# Patient Record
Sex: Female | Born: 1935 | Race: White | Hispanic: No | Smoking: Former smoker
Health system: Southern US, Community
[De-identification: ages and names within clinical notes are randomized; demographics above are authoritative.]

## PROBLEM LIST (undated history)

## (undated) DIAGNOSIS — F0390 Unspecified dementia without behavioral disturbance: Secondary | ICD-10-CM

## (undated) DIAGNOSIS — E119 Type 2 diabetes mellitus without complications: Secondary | ICD-10-CM

## (undated) DIAGNOSIS — M810 Age-related osteoporosis without current pathological fracture: Secondary | ICD-10-CM

## (undated) DIAGNOSIS — I10 Essential (primary) hypertension: Secondary | ICD-10-CM

## (undated) DIAGNOSIS — J449 Chronic obstructive pulmonary disease, unspecified: Secondary | ICD-10-CM

## (undated) HISTORY — DX: Type 2 diabetes mellitus without complications: E11.9

## (undated) HISTORY — DX: Chronic obstructive pulmonary disease, unspecified: J44.9

## (undated) HISTORY — DX: Age-related osteoporosis without current pathological fracture: M81.0

## (undated) HISTORY — PX: TOTAL HIP ARTHROPLASTY: SHX124

## (undated) HISTORY — PX: OTHER SURGICAL HISTORY: SHX169

## (undated) HISTORY — DX: Essential (primary) hypertension: I10

---

## 2012-01-21 DIAGNOSIS — I4891 Unspecified atrial fibrillation: Secondary | ICD-10-CM

## 2012-01-22 DIAGNOSIS — I319 Disease of pericardium, unspecified: Secondary | ICD-10-CM

## 2012-01-23 DIAGNOSIS — I4891 Unspecified atrial fibrillation: Secondary | ICD-10-CM

## 2012-01-24 DIAGNOSIS — I4891 Unspecified atrial fibrillation: Secondary | ICD-10-CM

## 2012-02-07 ENCOUNTER — Encounter: Payer: Self-pay | Admitting: Cardiology

## 2012-02-08 ENCOUNTER — Encounter: Payer: Medicare Other | Admitting: Physician Assistant

## 2019-05-05 ENCOUNTER — Encounter (HOSPITAL_COMMUNITY): Payer: Self-pay | Admitting: Internal Medicine

## 2019-05-05 ENCOUNTER — Inpatient Hospital Stay (HOSPITAL_COMMUNITY)
Admission: AD | Admit: 2019-05-05 | Discharge: 2019-06-09 | DRG: 480 | Disposition: E | Payer: Medicare Other | Source: Other Acute Inpatient Hospital | Attending: Internal Medicine | Admitting: Internal Medicine

## 2019-05-05 DIAGNOSIS — E1122 Type 2 diabetes mellitus with diabetic chronic kidney disease: Secondary | ICD-10-CM | POA: Diagnosis present

## 2019-05-05 DIAGNOSIS — I10 Essential (primary) hypertension: Secondary | ICD-10-CM | POA: Diagnosis present

## 2019-05-05 DIAGNOSIS — K5641 Fecal impaction: Secondary | ICD-10-CM | POA: Diagnosis not present

## 2019-05-05 DIAGNOSIS — M810 Age-related osteoporosis without current pathological fracture: Secondary | ICD-10-CM | POA: Diagnosis present

## 2019-05-05 DIAGNOSIS — J9601 Acute respiratory failure with hypoxia: Secondary | ICD-10-CM | POA: Diagnosis not present

## 2019-05-05 DIAGNOSIS — I129 Hypertensive chronic kidney disease with stage 1 through stage 4 chronic kidney disease, or unspecified chronic kidney disease: Secondary | ICD-10-CM | POA: Diagnosis present

## 2019-05-05 DIAGNOSIS — N131 Hydronephrosis with ureteral stricture, not elsewhere classified: Secondary | ICD-10-CM | POA: Diagnosis present

## 2019-05-05 DIAGNOSIS — Z87891 Personal history of nicotine dependence: Secondary | ICD-10-CM

## 2019-05-05 DIAGNOSIS — Z794 Long term (current) use of insulin: Secondary | ICD-10-CM

## 2019-05-05 DIAGNOSIS — Z79899 Other long term (current) drug therapy: Secondary | ICD-10-CM

## 2019-05-05 DIAGNOSIS — S72009A Fracture of unspecified part of neck of unspecified femur, initial encounter for closed fracture: Secondary | ICD-10-CM | POA: Diagnosis present

## 2019-05-05 DIAGNOSIS — Z09 Encounter for follow-up examination after completed treatment for conditions other than malignant neoplasm: Secondary | ICD-10-CM

## 2019-05-05 DIAGNOSIS — Y92129 Unspecified place in nursing home as the place of occurrence of the external cause: Secondary | ICD-10-CM | POA: Diagnosis not present

## 2019-05-05 DIAGNOSIS — J90 Pleural effusion, not elsewhere classified: Secondary | ICD-10-CM | POA: Diagnosis present

## 2019-05-05 DIAGNOSIS — Z66 Do not resuscitate: Secondary | ICD-10-CM | POA: Diagnosis present

## 2019-05-05 DIAGNOSIS — N179 Acute kidney failure, unspecified: Secondary | ICD-10-CM | POA: Diagnosis not present

## 2019-05-05 DIAGNOSIS — I482 Chronic atrial fibrillation, unspecified: Secondary | ICD-10-CM | POA: Diagnosis present

## 2019-05-05 DIAGNOSIS — S52612A Displaced fracture of left ulna styloid process, initial encounter for closed fracture: Secondary | ICD-10-CM | POA: Diagnosis present

## 2019-05-05 DIAGNOSIS — R5383 Other fatigue: Secondary | ICD-10-CM

## 2019-05-05 DIAGNOSIS — E119 Type 2 diabetes mellitus without complications: Secondary | ICD-10-CM

## 2019-05-05 DIAGNOSIS — F0390 Unspecified dementia without behavioral disturbance: Secondary | ICD-10-CM | POA: Diagnosis present

## 2019-05-05 DIAGNOSIS — Z8249 Family history of ischemic heart disease and other diseases of the circulatory system: Secondary | ICD-10-CM | POA: Diagnosis not present

## 2019-05-05 DIAGNOSIS — J449 Chronic obstructive pulmonary disease, unspecified: Secondary | ICD-10-CM | POA: Diagnosis present

## 2019-05-05 DIAGNOSIS — G9341 Metabolic encephalopathy: Secondary | ICD-10-CM | POA: Diagnosis not present

## 2019-05-05 DIAGNOSIS — R0902 Hypoxemia: Secondary | ICD-10-CM

## 2019-05-05 DIAGNOSIS — W1830XA Fall on same level, unspecified, initial encounter: Secondary | ICD-10-CM | POA: Diagnosis present

## 2019-05-05 DIAGNOSIS — Z20822 Contact with and (suspected) exposure to covid-19: Secondary | ICD-10-CM | POA: Diagnosis present

## 2019-05-05 DIAGNOSIS — E1165 Type 2 diabetes mellitus with hyperglycemia: Secondary | ICD-10-CM | POA: Diagnosis present

## 2019-05-05 DIAGNOSIS — Z833 Family history of diabetes mellitus: Secondary | ICD-10-CM

## 2019-05-05 DIAGNOSIS — J9811 Atelectasis: Secondary | ICD-10-CM | POA: Diagnosis not present

## 2019-05-05 DIAGNOSIS — R109 Unspecified abdominal pain: Secondary | ICD-10-CM

## 2019-05-05 DIAGNOSIS — N183 Chronic kidney disease, stage 3 unspecified: Secondary | ICD-10-CM | POA: Diagnosis present

## 2019-05-05 DIAGNOSIS — Z419 Encounter for procedure for purposes other than remedying health state, unspecified: Secondary | ICD-10-CM

## 2019-05-05 DIAGNOSIS — R0989 Other specified symptoms and signs involving the circulatory and respiratory systems: Secondary | ICD-10-CM

## 2019-05-05 DIAGNOSIS — S72142A Displaced intertrochanteric fracture of left femur, initial encounter for closed fracture: Principal | ICD-10-CM | POA: Diagnosis present

## 2019-05-05 DIAGNOSIS — D72829 Elevated white blood cell count, unspecified: Secondary | ICD-10-CM | POA: Diagnosis not present

## 2019-05-05 DIAGNOSIS — K219 Gastro-esophageal reflux disease without esophagitis: Secondary | ICD-10-CM | POA: Diagnosis present

## 2019-05-05 HISTORY — DX: Unspecified dementia, unspecified severity, without behavioral disturbance, psychotic disturbance, mood disturbance, and anxiety: F03.90

## 2019-05-05 HISTORY — DX: Chronic atrial fibrillation, unspecified: I48.20

## 2019-05-05 LAB — SARS CORONAVIRUS 2 (TAT 6-24 HRS): SARS Coronavirus 2: NEGATIVE

## 2019-05-05 LAB — HEMOGLOBIN A1C
Hgb A1c MFr Bld: 9.1 % — ABNORMAL HIGH (ref 4.8–5.6)
Mean Plasma Glucose: 214.47 mg/dL

## 2019-05-05 LAB — MRSA PCR SCREENING: MRSA by PCR: NEGATIVE

## 2019-05-05 LAB — GLUCOSE, CAPILLARY
Glucose-Capillary: 162 mg/dL — ABNORMAL HIGH (ref 70–99)
Glucose-Capillary: 145 mg/dL — ABNORMAL HIGH (ref 70–99)

## 2019-05-05 MED ORDER — HYDROCODONE-ACETAMINOPHEN 5-325 MG PO TABS
1.0000 | ORAL_TABLET | Freq: Four times a day (QID) | ORAL | Status: DC | PRN
  Administered 2019-05-05: 1 via ORAL
  Filled 2019-05-05: qty 1

## 2019-05-05 MED ORDER — INSULIN ASPART 100 UNIT/ML ~~LOC~~ SOLN
0.0000 [IU] | Freq: Three times a day (TID) | SUBCUTANEOUS | Status: DC
  Administered 2019-05-05 – 2019-05-08 (×2): 2 [IU] via SUBCUTANEOUS
  Administered 2019-05-08: 1 [IU] via SUBCUTANEOUS
  Administered 2019-05-08 – 2019-05-09 (×2): 2 [IU] via SUBCUTANEOUS
  Administered 2019-05-11: 1 [IU] via SUBCUTANEOUS
  Administered 2019-05-11 – 2019-05-12 (×2): 2 [IU] via SUBCUTANEOUS
  Administered 2019-05-12 – 2019-05-13 (×2): 1 [IU] via SUBCUTANEOUS

## 2019-05-05 MED ORDER — POLYETHYLENE GLYCOL 3350 17 G PO PACK
17.0000 g | PACK | Freq: Every day | ORAL | Status: DC | PRN
  Administered 2019-05-08: 17 g via ORAL
  Filled 2019-05-05 (×3): qty 1

## 2019-05-05 MED ORDER — METHOCARBAMOL 500 MG PO TABS
500.0000 mg | ORAL_TABLET | Freq: Four times a day (QID) | ORAL | Status: DC | PRN
  Administered 2019-05-05: 500 mg via ORAL
  Filled 2019-05-05: qty 1

## 2019-05-05 MED ORDER — BISACODYL 5 MG PO TBEC
5.0000 mg | DELAYED_RELEASE_TABLET | Freq: Every day | ORAL | Status: DC | PRN
  Administered 2019-05-08 – 2019-05-09 (×2): 5 mg via ORAL
  Filled 2019-05-05 (×3): qty 1

## 2019-05-05 MED ORDER — PANTOPRAZOLE SODIUM 40 MG PO TBEC
40.0000 mg | DELAYED_RELEASE_TABLET | Freq: Every day | ORAL | Status: DC
  Administered 2019-05-06 – 2019-05-13 (×5): 40 mg via ORAL
  Filled 2019-05-05 (×7): qty 1

## 2019-05-05 MED ORDER — METOPROLOL TARTRATE 50 MG PO TABS
50.0000 mg | ORAL_TABLET | Freq: Two times a day (BID) | ORAL | Status: DC
  Administered 2019-05-05 – 2019-05-13 (×14): 50 mg via ORAL
  Filled 2019-05-05 (×15): qty 1

## 2019-05-05 MED ORDER — INSULIN ASPART 100 UNIT/ML ~~LOC~~ SOLN: 0.0000 [IU] | Freq: Every day | SUBCUTANEOUS | Status: DC

## 2019-05-05 MED ORDER — LACTATED RINGERS IV SOLN: INTRAVENOUS | Status: DC

## 2019-05-05 MED ORDER — INSULIN GLARGINE 100 UNIT/ML ~~LOC~~ SOLN
15.0000 [IU] | Freq: Every day | SUBCUTANEOUS | Status: DC
  Administered 2019-05-05 – 2019-05-09 (×3): 15 [IU] via SUBCUTANEOUS
  Filled 2019-05-05 (×6): qty 0.15

## 2019-05-05 MED ORDER — DOCUSATE SODIUM 100 MG PO CAPS
100.0000 mg | ORAL_CAPSULE | Freq: Two times a day (BID) | ORAL | Status: DC
  Administered 2019-05-05 – 2019-05-13 (×10): 100 mg via ORAL
  Filled 2019-05-05 (×14): qty 1

## 2019-05-05 MED ORDER — MORPHINE SULFATE (PF) 2 MG/ML IV SOLN
0.5000 mg | INTRAVENOUS | Status: DC | PRN
  Administered 2019-05-07: 0.5 mg via INTRAVENOUS
  Filled 2019-05-05: qty 1

## 2019-05-05 MED ORDER — METHOCARBAMOL 1000 MG/10ML IJ SOLN: 500.0000 mg | Freq: Four times a day (QID) | INTRAVENOUS | Status: DC | PRN

## 2019-05-05 NOTE — Progress Notes (Addendum)
1500 Received pt from [facility] via [place]. Pt is alert and oriented x2. Left arm with splint intact, fingers are warm and mobile. LLE is shorter than the RLE. Pt came with foley. 1800 Pt is getting more confused and disoriented. Frequent checks done.

## 2019-05-05 NOTE — H&P (Signed)
History and Physical    Rhonda Hamilton SAY:[id number] DOB: 1927/10/10 DOA: 04/11/2019  PCP: Patient, No Pcp Per  Patient coming from: [facility] in [place]; NOK: Sangita, Zani, [phone]; [phone] (daughter-in-law)  Chief Complaint: Fall  HPI: Rhonda Hamilton is a 86 y.o. female with medical history significant of afib, dementia, HTN, and DM presenting with a fall.  Patient reports that she was in her "apartment" last night and she had a mechanical fall in the floor.  She is not hurting at this time.  She is oriented to person and place but otherwise is not able to provide significant history.    ED Course: Transfer from [facility]:  Dr. Rodell Perna agrees with admission, will operate, requests [facility] admission.  Dr. Sherrie Sport calling from [facility]  Patient is 86yo female, lives in group home, afib, HTN, DM.  She fell and had intertrochanteric L hip fracture with some displacement.  Also L ulnar styloid fracture.  She is mildly confused.  Normal CBC, BMP with creatinine 1.63 (unknown baseline).  UA with 30-40 WBC so given IVF and antibiotics.  Patient is awake, mildly confused, has baseline dementia.  She is active at baseline.  Orthopedics there will not see the patient because she was admitted while their only orthopedist was not on call.   HR 80, no RVR at this time.    Review of Systems: As per HPI; otherwise review of systems reviewed and negative. Very limited by her baseline cognitive impairment.   Past Medical History:  Diagnosis Date  . Atrial fibrillation, chronic ([place]) 05/04/2019  . COPD (chronic obstructive pulmonary disease) ([place])   . Dementia ([place])   . Diabetes mellitus ([place])   . Hypertension   . Osteoporosis     Past Surgical History:  Procedure Laterality Date  . hysterectomy----unknown      Social History   Socioeconomic History  . Marital status: Widowed    Spouse name: Not on file  . Number of children: Not on file  . Years of education: Not on file  .  Highest education level: Not on file  Occupational History  . Occupation: retired  Tobacco Use  . Smoking status: Former Research scientist (life sciences)  . Smokeless tobacco: Never Used  Substance and Sexual Activity  . Alcohol use: No  . Drug use: No  . Sexual activity: Not on file  Other Topics Concern  . Not on file  Social History Narrative  . Not on file   Social Determinants of Health   Financial Resource Strain:   . Difficulty of Paying Living Expenses: Not on file  Food Insecurity:   . Worried About Charity fundraiser in the Last Year: Not on file  . Ran Out of Food in the Last Year: Not on file  Transportation Needs:   . Lack of Transportation (Medical): Not on file  . Lack of Transportation (Non-Medical): Not on file  Physical Activity:   . Days of Exercise per Week: Not on file  . Minutes of Exercise per Session: Not on file  Stress:   . Feeling of Stress : Not on file  Social Connections:   . Frequency of Communication with Friends and Family: Not on file  . Frequency of Social Gatherings with Friends and Family: Not on file  . Attends Religious Services: Not on file  . Active Member of Clubs or Organizations: Not on file  . Attends Archivist Meetings: Not on file  . Marital Status: Not on file  Intimate Partner  Violence:   . Fear of Current or Ex-Partner: Not on file  . Emotionally Abused: Not on file  . Physically Abused: Not on file  . Sexually Abused: Not on file    No Known Allergies  Family History  Problem Relation Age of Onset  . Diabetes Mother   . Hypertension Mother     Prior to Admission medications   Medication Sig Start Date End Date Taking? Authorizing Provider  atenolol (TENORMIN) 50 MG tablet Take 50 mg by mouth daily.    [provider]  Calcium Carbonate (CALCIUM 500 PO) Take 500 mg by mouth 2 (two) times daily.    [provider]  Cholecalciferol (VITAMIN D3 PO) Take 1,200 mg by mouth daily.    [provider]    docusate sodium (COLACE) 100 MG capsule Take 100 mg by mouth daily.    [provider]  glipiZIDE (GLUCOTROL) 5 MG tablet Take 5 mg by mouth daily.    [provider]  insulin glargine (LANTUS) 100 UNIT/ML injection Inject 15 Units into the skin at bedtime.    [provider]  pantoprazole (PROTONIX) 40 MG tablet Take 40 mg by mouth daily.    [provider]  polyethylene glycol (MIRALAX / GLYCOLAX) packet Take 17 g by mouth daily.    [provider]  sitaGLIPtin (JANUVIA) 100 MG tablet Take 100 mg by mouth daily.    [provider]    Physical Exam: Vitals:   04/30/2019 1500  BP: 129/64  Pulse: 85  Resp: (!) 21  Temp: 99.1 F (37.3 C)  TempSrc: Oral  SpO2: 99%     . General:  Appears calm and comfortable and is NAD . Eyes:  EOMI, normal lids, iris . ENT:  grossly normal hearing, lips & tongue, mmm; edentulous . Neck:  no LAD, masses or thyromegaly . Cardiovascular:  RRR, no m/r/g. No LE edema.  Marland Kitchen Respiratory:   CTA bilaterally with no wheezes/rales/rhonchi.  Normal respiratory effort. . Abdomen:  soft, NT, ND, NABS . Skin:  no rash or induration seen on limited exam . Musculoskeletal:  L wrist is in a splint.  L leg with shortening and mild external rotation . Lower extremity:  No LE edema.  Limited foot exam with no ulcerations.  2+ distal pulses. Marland Kitchen Psychiatric:  grossly normal mood and affect, speech fluent and appropriate, AOx2 . Neurologic:  CN 2-12 grossly intact, moves all extremities in coordinated fashion    Radiological Exams on Admission: No results found.  EKG: pending   Labs on Admission: I have personally reviewed the available labs and imaging studies at the time of the admission.  Pertinent labs from [facility]:   BUN 29/Creatinine 1.45 Glucose 310 INR 1.1 WBC 12 Hgb 11.3 A1c 9.1   Assessment/Plan Principal Problem:   Closed intertrochanteric fracture of hip, left, initial encounter [facility]) Active  Problems:   Traumatic closed fracture of ulnar styloid with minimal displacement, left, initial encounter   Hypertension   Diabetes mellitus (HCC)   Dementia (HCC)   COPD (chronic obstructive pulmonary disease) (HCC)   CKD (chronic kidney disease), stage III   Atrial fibrillation, chronic (HCC)   L hip fracture -Mechanical fall resulting in hip fracture -Orthopedics consult in AM -NPO after midnight in anticipation of surgical repair tomorrow -SCDs overnight, start Lovenox post-operatively (or as per ortho) -Will order pre-op EKG  -Pain control with Robxain, Vicodin, and Morphine prn -SW consult for rehab placement -Will need PT consult post-operatively -Hip  fracture order set utilized  L ulnar styloid fracture -Patient had a wrist splint placed at [facility] -Anticipate that this may also need surgical repair, but will defer to Dr. Ophelia Charter  HTN -Med rec was unable to be verified at the time of admission -She had both Atenolol and Lopressor on her medication list -Will continue Lopressor pre-operatively for now -Will hold Lisinopril due to low-normal BP  Uncontrolled DM -A1c indicates poor control -hold Glucotrol, Januvia -Continue Lantus -Cover with moderate-scale SSI  Dementia -Family acknowledges perhaps mild cognitive impairment -Patient exhibited behaviors during my evaluation (picking at pulse ox, unawareness of date, difficulty answering questions but not otherwise with apparent encephalopathy  COPD -Remote smoking history -She doesn't appear to be taking medication for this at this time -Will follow clinically  Stage 3 CKD -Uncertain baseline -Will follow with gentle IVF hydration overnight  Chronic atrial fibrillation -Rate controlled with BB -Does not appear to be on [facility] -Awaiting med rec for confirmation    Note: This patient has been tested and is negative for the novel coronavirus COVID-19 based on testing at [facility]; she will be tested here on the [employer]  platform as per protocol.    DVT prophylaxis:  SCDs until approved for Lovenox by orthopedics Code Status:  DNR - confirmed with family Family Communication: None present; I spoke with her daughter-in-law and son at the time of admission Disposition Plan:  Home once clinically improved Consults called: Orthopedics; SW, Nutrition; will need PT post-operatively  Admission status: Admit - It is my clinical opinion that admission to INPATIENT is reasonable and necessary because of the expectation that this patient will require hospital care that crosses at least 2 midnights to treat this condition based on the medical complexity of the problems presented.  Given the aforementioned information, the predictability of an adverse outcome is felt to be significant.    Jonah Blue MD [facility]   How to contact the [facility] Attending or Consulting provider 7A - 7P or covering provider during after hours 7P -7A, for this patient?  1. Check the care team in [facility] and look for a) attending/consulting TRH provider listed and b) the [facility] team listed 2. Log into [email or web address] and use [facility] [place]'s universal password to access. If you do not have the password, please contact the hospital operator. 3. Locate the [facility] provider you are looking for under [facility] and page to a number that you can be directly reached. 4. If you still have difficulty reaching the provider, please page the [facility] (Director on Call) for the Hospitalists listed on amion for assistance.   05-11-19, 5:30 PM

## 2019-05-06 ENCOUNTER — Other Ambulatory Visit: Payer: Self-pay

## 2019-05-06 DIAGNOSIS — S72142A Displaced intertrochanteric fracture of left femur, initial encounter for closed fracture: Principal | ICD-10-CM

## 2019-05-06 LAB — BASIC METABOLIC PANEL WITH GFR
Anion gap: 7 (ref 5–15)
BUN: 22 mg/dL (ref 8–23)
CO2: 29 mmol/L (ref 22–32)
Calcium: 8.8 mg/dL — ABNORMAL LOW (ref 8.9–10.3)
Chloride: 105 mmol/L (ref 98–111)
Creatinine, Ser: 1.17 mg/dL — ABNORMAL HIGH (ref 0.44–1.00)
GFR calc Af Amer: 48 mL/min — ABNORMAL LOW
GFR calc non Af Amer: 41 mL/min — ABNORMAL LOW
Glucose, Bld: 96 mg/dL (ref 70–99)
Potassium: 4.5 mmol/L (ref 3.5–5.1)
Sodium: 141 mmol/L (ref 135–145)

## 2019-05-06 LAB — GLUCOSE, CAPILLARY
Glucose-Capillary: 79 mg/dL (ref 70–99)
Glucose-Capillary: 71 mg/dL (ref 70–99)
Glucose-Capillary: 84 mg/dL (ref 70–99)

## 2019-05-06 LAB — CBC
HCT: 30.4 % — ABNORMAL LOW (ref 36.0–46.0)
Hemoglobin: 9.6 g/dL — ABNORMAL LOW (ref 12.0–15.0)
MCH: 28.4 pg (ref 26.0–34.0)
MCHC: 31.6 g/dL (ref 30.0–36.0)
MCV: 89.9 fL (ref 80.0–100.0)
Platelets: 172 10*3/uL (ref 150–400)
RBC: 3.38 MIL/uL — ABNORMAL LOW (ref 3.87–5.11)
RDW: 14.5 % (ref 11.5–15.5)
WBC: 10.6 10*3/uL — ABNORMAL HIGH (ref 4.0–10.5)
nRBC: 0 % (ref 0.0–0.2)

## 2019-05-06 MED ORDER — CHLORHEXIDINE GLUCONATE CLOTH 2 % EX PADS
6.0000 | MEDICATED_PAD | Freq: Every day | CUTANEOUS | Status: DC
  Administered 2019-05-06 – 2019-05-07 (×2): 6 via TOPICAL

## 2019-05-06 MED ORDER — POVIDONE-IODINE 10 % EX SWAB: 2.0000 "application " | Freq: Once | CUTANEOUS | Status: DC

## 2019-05-06 MED ORDER — ENSURE ENLIVE PO LIQD
237.0000 mL | Freq: Two times a day (BID) | ORAL | Status: DC
  Administered 2019-05-06 – 2019-05-11 (×5): 237 mL via ORAL

## 2019-05-06 MED ORDER — ADULT MULTIVITAMIN W/MINERALS CH
1.0000 | ORAL_TABLET | Freq: Every day | ORAL | Status: DC
  Administered 2019-05-06 – 2019-05-13 (×5): 1 via ORAL
  Filled 2019-05-06 (×7): qty 1

## 2019-05-06 MED ORDER — CEFAZOLIN SODIUM-DEXTROSE 2-4 GM/100ML-% IV SOLN
2.0000 g | INTRAVENOUS | Status: AC
  Administered 2019-05-07: 13:00:00 2 g via INTRAVENOUS
  Filled 2019-05-06: qty 100

## 2019-05-06 MED ORDER — CHLORHEXIDINE GLUCONATE 4 % EX LIQD: 60.0000 mL | Freq: Once | CUTANEOUS | Status: DC

## 2019-05-06 NOTE — H&P (View-Only) (Signed)
Reason for Consult:left closed comminuted intertrochanteric hip fracture.  Referring Physician: Jonah Blue MD  Rhonda Hamilton is an 86 y.o. female.  HPI: 86 year old female resident at [facility] nursing center had a mechanical fall with severe left hip pain inability to walk and left intertrochanteric hip fracture.  X-ray was ordered by Dr.Hasanaj who called me with the results.  Patient was transferred to [facility] for hip stabilization procedure.  Patient has a history of diabetes dementia hypertension and osteoporosis COPD.  She had been ambulatory with a walker.  Wrist x-ray showed ulnar styloid fracture with intact radius.CXR shows previous ORIF left proximal humerus fracture with no new injury.   The patient's been in the wrist splint.  Son Rhonda Hamilton is power of attorney his phone [phone].    Patient has some dementia and is confused.  Past Medical History:  Diagnosis Date  . Atrial fibrillation, chronic (HCC) 04/27/2019  . COPD (chronic obstructive pulmonary disease) (HCC)   . Dementia (HCC)   . Diabetes mellitus (HCC)   . Hypertension   . Osteoporosis     Past Surgical History:  Procedure Laterality Date  . hysterectomy----unknown      Family History  Problem Relation Age of Onset  . Diabetes Mother   . Hypertension Mother     Social History:  reports that she has quit smoking. She has never used smokeless tobacco. She reports that she does not drink alcohol or use drugs.  Allergies: No Known Allergies  Medications: I have reviewed the patient's current medications.  Results for orders placed or performed during the hospital encounter of 05/11/2019 (from the past 48 hour(s))  Hemoglobin A1c     Status: Abnormal   Collection Time: 04/16/2019  4:17 PM  Result Value Ref Range   Hgb A1c MFr Bld 9.1 (H) 4.8 - 5.6 %    Comment: (NOTE) Pre diabetes:          5.7%-6.4% Diabetes:              >6.4% Glycemic control for   <7.0% adults with diabetes    Mean Plasma  Glucose 214.47 mg/dL    Comment: Performed at [facility], [address] [place], [place] [address]  Glucose, capillary     Status: Abnormal   Collection Time: 05/11/2019  4:27 PM  Result Value Ref Range   Glucose-Capillary 162 (H) 70 - 99 mg/dL  SARS CORONAVIRUS 2 (TAT 6-24 HRS) Nasopharyngeal     Status: None   Collection Time: 04/13/2019  5:12 PM   Specimen: Nasopharyngeal  Result Value Ref Range   SARS Coronavirus 2 NEGATIVE NEGATIVE    Comment: (NOTE) SARS-CoV-2 target nucleic acids are NOT DETECTED. The SARS-CoV-2 RNA is generally detectable in upper and lower respiratory specimens during the acute phase of infection. Negative results do not preclude SARS-CoV-2 infection, do not rule out co-infections with other pathogens, and should not be used as the sole basis for treatment or other patient management decisions. Negative results must be combined with clinical observations, patient history, and epidemiological information. The expected result is Negative. Fact Sheet for Patients: HairSlick.no Fact Sheet for Healthcare Providers: quierodirigir.com This test is not yet approved or cleared by the [place] FDA and  has been authorized for detection and/or diagnosis of SARS-CoV-2 by FDA under an Emergency Use Authorization (EUA). This EUA will remain  in effect (meaning this test can be used) for the duration of the COVID-19 declaration under Section 56 4(b)(1) of the Act,  21 U.S.C. section 360bbb-3(b)(1), unless the authorization is terminated or revoked sooner. Performed at [facility], [address] [place], [place] [address]   MRSA PCR Screening     Status: None   Collection Time: 04/29/2019  6:32 PM   Specimen: Nasopharyngeal  Result Value Ref Range   MRSA by PCR NEGATIVE NEGATIVE    Comment:        The GeneXpert MRSA Assay (FDA approved for NASAL specimens only), is one component of  a comprehensive MRSA colonization surveillance program. It is not intended to diagnose MRSA infection nor to guide or monitor treatment for MRSA infections. Performed at [facility], [address] [place],  [address]   Glucose, capillary     Status: Abnormal   Collection Time: 05/08/2019  8:45 PM  Result Value Ref Range   Glucose-Capillary 145 (H) 70 - 99 mg/dL    No results found.  ROS chart review due to patient's dementia.  Positive for osteoporosis, atrial fib, dementia, hypertension, diabetes.  Fall with ulnar styloid fracture and left comminuted intertrochanteric hip fracture angulated and displaced. Blood pressure (!) 104/43, pulse 93, temperature 97.9 F (36.6 C), temperature source Oral, resp. rate 16, SpO2 95 %. Physical Exam  Constitutional: She appears well-developed and well-nourished.  HENT:  Head: Normocephalic.  Eyes: Pupils are equal, round, and reactive to light.  Neck: No tracheal deviation present. No thyromegaly present.  Cardiovascular: Normal rate and regular rhythm.  Respiratory: Effort normal and breath sounds normal. No respiratory distress. She has no wheezes.  GI: Soft. Bowel sounds are normal.  Musculoskeletal:     Cervical back: Normal range of motion and neck supple.  Neurological: She is alert.  Confused oriented x2.  Skin: Skin is warm and dry.  Psychiatric:  Patient confused oriented x1.  Dementia.    Assessment/Plan: Left intertrochanteric hip fracture.  Comminuted closed displaced and angulated.  Plan trochanteric nail Wednesday 12:30 PM.  Call placed to son a message left to obtain informed consent.  I discussed plan with patient however she has  confusion.  Patient had some increased risk due to other medical problems, COPD, confusion, hypertension, history of atrial fibrillation, diabetes.  Patient will be 50% weightbearing likely after the procedure and work on transfers to recliner wheelchair for 6 weeks and then she should  be able to resume walking activity.  My cell [phone]  [name] C [name] 05/06/2019, 8:11 AM      

## 2019-05-06 NOTE — Progress Notes (Addendum)
Initial Nutrition Assessment  DOCUMENTATION CODES:   Not applicable  INTERVENTION:   -Downgrade diet to dysphagia 2 (mechanical soft) for ease of intake -MVI with minerals daily -Ensure Enlive po BID, each supplement provides 350 kcal and 20 grams of protein -Magic cup TID with meals, each supplement provides 290 kcal and 9 grams of protein  NUTRITION DIAGNOSIS:   Increased nutrient needs related to post-op healing as evidenced by estimated needs.  GOAL:   Patient will meet greater than or equal to 90% of their needs  MONITOR:   PO intake, Supplement acceptance, Labs, Weight trends, Skin, I & O's  REASON FOR ASSESSMENT:   Consult Hip fracture protocol  ASSESSMENT:   Rhonda Hamilton is a 86 y.o. female with medical history significant of afib, dementia, HTN, and DM presenting with a fall.  Patient reports that she was in her "apartment" last night and she had a mechanical fall in the floor.  She is not hurting at this time.  She is oriented to person and place but otherwise is not able to provide significant history.  Pt admitted with lt hip fracture.   Reviewed I/O's: +58 ml x 24 hours  Per orthopedics notes, plan for trochanteric nailing on Wednesday (04/11/2019).   Spoke with pt at bedside, who was pleasant at time of visit. She was able to identify that she was at the hospice, however, though the was at [facility] (RD redirected). Pt confirms that she lived at a rehab facility, but was unable to recall the name ([facility]). Pt reports poor appetite PTA due to difficulty chewing (pt with no teeth). She denies having or eating with dentures. She reports the often consumes food that her son brings in, such as collards and pinto beans. Noted meal completion 25%.   No wt hx available to assess at this time. Pt is unsure if she lost weight. Wt obtained via bedscale.   Pt is at risk for malnutrition, however, RD unable to identify at this time without further history.    Discussed importance of good meal and supplement intake to promote healing.   Pt with poor oral intake and would benefit from nutrient dense supplement. One Ensure Enlive supplement provides 350 kcals, 20 grams protein, and 44-45 grams of carbohydrate vs one Glucerna shake supplement, which provides 220 kcals, 10 grams of protein, and 26 grams of carbohydrate. Given pt's hx of DM, RD will continue to monitor PO intake, CBGS, and adjust supplement regimen as appropriate.   Lab Results  Component Value Date   HGBA1C 9.1 (H) 04/17/2019  PTA DM medications are 5 mg glipizide daily and 15 units insulin glargine q HS. Per ADA's Standards of Medical Care of Diabetes, glycemic targets for older adults who have multiple co-morbidities, cognitive impairments, and functional dependence should be less stringent (Hgb A1c <8.0-8.5).    Labs reviewed: CBGS: 162 (inpatient orders for glycemic control are 0-5 units insulin aspart q HS, 0-9 units insulin aspart TID with meals, and 15 units insulin glargine q HS).   NUTRITION - FOCUSED PHYSICAL EXAM:    Most Recent Value  Orbital Region  Mild depletion  Upper Arm Region  Mild depletion  Thoracic and Lumbar Region  No depletion  Buccal Region  No depletion  Temple Region  Mild depletion  Clavicle Bone Region  Mild depletion  Clavicle and Acromion Bone Region  No depletion  Scapular Bone Region  No depletion  Dorsal Hand  Moderate depletion  Patellar Region  Mild depletion  Anterior Thigh Region  Mild depletion  Posterior Calf Region  Mild depletion  Edema (RD Assessment)  None  Hair  Reviewed  Eyes  Reviewed  Mouth  Reviewed  Skin  Reviewed  Nails  Reviewed       Diet Order:   Diet Order            Diet NPO time specified  Diet effective midnight        Diet Carb Modified Fluid consistency: Thin; Room service appropriate? No  Diet effective now              EDUCATION NEEDS:   Education needs have been addressed  Skin:  Skin  Assessment: Reviewed RN Assessment  Last BM:  Jun 03, 2019  Height:   Ht Readings from Last 1 Encounters:  05/06/19 5\' 4"  (1.626 m)    Weight:   Wt Readings from Last 1 Encounters:  05/06/19 68.8 kg    Ideal Body Weight:  54.5 kg  BMI:  Body mass index is 26.04 kg/m.  Estimated Nutritional Needs:   Kcal:  1550-1750  Protein:  70-85 grams  Fluid:  > 1.5 L    Rhonda Hamilton A. Jimmye Norman, RD, LDN, [place] Registered Dietitian II Certified Diabetes Care and Education Specialist Pager: [phone] After hours Pager: [phone]

## 2019-05-06 NOTE — Progress Notes (Signed)
PROGRESS NOTE    Leinaala Catanese  YQM:[id number] DOB: [birth year] DOA: 05/08/2019 PCP: Patient, No Pcp Per     Brief Narrative:  Patient is an 86 year old female resident at [facility] who had a fall that was followed by severe left hip pain and inability to walk.  Past medical history significant for dementia, diabetes, hypertension, osteoporosis, and COPD.  She was normally ambulatory with a walker.  It was found she had a left intertrochanteric hip fracture.  Wrist x-ray shows an ulnar styloid fracture.  She was transferred to [facility] for surgery.   New events last 24 hours / Subjective: Patient reports feeling well controlled today. No complaints. Scheduled for OR tomorrow.   Assessment & Plan:   Principal Problem:   Closed intertrochanteric fracture of hip, left, initial encounter (HCC)  Appreciate ortho  Norco 5-325 mg, 1-2 tabs q 6 hrs prn moderate pain  Robaxin 500 mg every 6 hours as needed for muscle spasm  Morphine 0.5 mg every 2 hours as needed for severe pain  Appreciate dietician  Active Problems:   Traumatic closed fracture of ulnar styloid with minimal displacement, left, initial encounter  Splinting  Appreciate ortho  Pain management as above    Hypertension  Metoprolol 50 mg twice daily    Diabetes mellitus (HCC)  Sliding scale insulin with meals    Dementia (HCC)  Stable    CKD (chronic kidney disease), stage III  Monitor BMP    Atrial fibrillation, chronic (HCC)  Stable  DVT prophylaxis: SCDs Code Status: DNR Family Communication: Son Greggory Stallion is POA, [phone] Disposition Plan: resides at [facility]; d/c to rehab vs home pending how she does post op   Consultants:   Ortho  Antimicrobials:  Anti-infectives (From admission, onward)   None       Objective: Vitals:   05/06/19 0353 05/06/19 0759 05/06/19 0905 05/06/19 1138  BP: 113/71 (!) 104/43 (!) 104/43   Pulse: 81 93 93   Resp: 16 16    Temp: 98.8 F (37.1 C) 97.9 F (36.6  C)    TempSrc:  Oral    SpO2: 94% 95%    Weight:    68.8 kg  Height:    5\' 4"  (1.626 m)    Intake/Output Summary (Last 24 hours) at 05/06/2019 1139 Last data filed at 05/06/2019 0900 Gross per 24 hour  Intake 177.87 ml  Output 450 ml  Net -272.13 ml   Filed Weights   05/06/19 1138  Weight: 68.8 kg    Examination:  General exam: Appears calm and comfortable  Respiratory system: Clear to auscultation. Respiratory effort normal. No respiratory distress. No conversational dyspnea.  Cardiovascular system: S1 & S2 heard, RRR. No pedal edema. Gastrointestinal system: Abdomen is nondistended, soft and mild ttp diffusely. Normal bowel sounds heard. Central nervous system: Alert, answers questions appropriately Skin: No rashes, lesions or ulcers on exposed skin  Psychiatry: Judgement and insight appear limited. Mood & affect appropriate.   Data Reviewed: I have personally reviewed following labs and imaging studies  CBC: Recent Labs  Lab 05/06/19 0736  WBC 10.6*  HGB 9.6*  HCT 30.4*  MCV 89.9  PLT 172   Basic Metabolic Panel: Recent Labs  Lab 05/06/19 0736  NA 141  K 4.5  CL 105  CO2 29  GLUCOSE 96  BUN 22  CREATININE 1.17*  CALCIUM 8.8*   GFR: Estimated Creatinine Clearance: 31 mL/min (A) (by C-G formula based on SCr of 1.17 mg/dL (H)).  HbA1C: Recent Labs  04/14/2019 1617  HGBA1C 9.1*   CBG: Recent Labs  Lab 04/29/2019 1627 04/22/2019 2045  GLUCAP 162* 145*    Recent Results (from the past 240 hour(s))  SARS CORONAVIRUS 2 (TAT 6-24 HRS) Nasopharyngeal     Status: None   Collection Time: 05/02/2019  5:12 PM   Specimen: Nasopharyngeal  Result Value Ref Range Status   SARS Coronavirus 2 NEGATIVE NEGATIVE Final    Comment: (NOTE) SARS-CoV-2 target nucleic acids are NOT DETECTED. The SARS-CoV-2 RNA is generally detectable in upper and lower respiratory specimens during the acute phase of infection. Negative results do not preclude SARS-CoV-2 infection, do  not rule out co-infections with other pathogens, and should not be used as the sole basis for treatment or other patient management decisions. Negative results must be combined with clinical observations, patient history, and epidemiological information. The expected result is Negative. Fact Sheet for Patients: SugarRoll.be Fact Sheet for Healthcare Providers: https://www.woods-mathews.com/ This test is not yet approved or cleared by the [place] FDA and  has been authorized for detection and/or diagnosis of SARS-CoV-2 by FDA under an Emergency Use Authorization (EUA). This EUA will remain  in effect (meaning this test can be used) for the duration of the COVID-19 declaration under Section 56 4(b)(1) of the Act, 21 U.S.C. section 360bbb-3(b)(1), unless the authorization is terminated or revoked sooner. Performed at [facility], [place] [address] [place], [place] [address]   MRSA PCR Screening     Status: None   Collection Time: 04/16/2019  6:32 PM   Specimen: Nasopharyngeal  Result Value Ref Range Status   MRSA by PCR NEGATIVE NEGATIVE Final    Comment:        The GeneXpert MRSA Assay (FDA approved for NASAL specimens only), is one component of a comprehensive MRSA colonization surveillance program. It is not intended to diagnose MRSA infection nor to guide or monitor treatment for MRSA infections. Performed at [facility], [place] [address] [place], [place] [address]      Scheduled Meds: . Chlorhexidine Gluconate Cloth  6 each Topical Daily  . docusate sodium  100 mg Oral BID  . insulin aspart  0-5 Units Subcutaneous QHS  . insulin aspart  0-9 Units Subcutaneous TID WC  . insulin glargine  15 Units Subcutaneous QHS  . metoprolol tartrate  50 mg Oral BID  . pantoprazole  40 mg Oral Daily   Continuous Infusions: . lactated ringers 75 mL/hr at 05/06/19 0013  . methocarbamol (ROBAXIN) IV       LOS: 1 day    Time  spent: 15 minutes   Shelda Pal, DO [facility] 05/06/2019, 11:39 AM   Available via Epic secure chat 7am-7pm After these hours, please refer to coverage provider listed on [email or web address]

## 2019-05-06 NOTE — Consult Note (Addendum)
Reason for Consult:left closed comminuted intertrochanteric hip fracture.  Referring Physician: Jonah Blue MD  Rhonda Hamilton is an 86 y.o. female.  HPI: 86 year old female resident at [facility] nursing center had a mechanical fall with severe left hip pain inability to walk and left intertrochanteric hip fracture.  X-ray was ordered by Dr.Hasanaj who called me with the results.  Patient was transferred to [facility] for hip stabilization procedure.  Patient has a history of diabetes dementia hypertension and osteoporosis COPD.  She had been ambulatory with a walker.  Wrist x-ray showed ulnar styloid fracture with intact radius.CXR shows previous ORIF left proximal humerus fracture with no new injury.   The patient's been in the wrist splint.  Son Greggory Stallion is power of attorney his phone [phone].    Patient has some dementia and is confused.  Past Medical History:  Diagnosis Date  . Atrial fibrillation, chronic (HCC) 04/27/2019  . COPD (chronic obstructive pulmonary disease) (HCC)   . Dementia (HCC)   . Diabetes mellitus (HCC)   . Hypertension   . Osteoporosis     Past Surgical History:  Procedure Laterality Date  . hysterectomy----unknown      Family History  Problem Relation Age of Onset  . Diabetes Mother   . Hypertension Mother     Social History:  reports that she has quit smoking. She has never used smokeless tobacco. She reports that she does not drink alcohol or use drugs.  Allergies: No Known Allergies  Medications: I have reviewed the patient's current medications.  Results for orders placed or performed during the hospital encounter of 04/18/2019 (from the past 48 hour(s))  Hemoglobin A1c     Status: Abnormal   Collection Time: 04/16/2019  4:17 PM  Result Value Ref Range   Hgb A1c MFr Bld 9.1 (H) 4.8 - 5.6 %    Comment: (NOTE) Pre diabetes:          5.7%-6.4% Diabetes:              >6.4% Glycemic control for   <7.0% adults with diabetes    Mean Plasma  Glucose 214.47 mg/dL    Comment: Performed at [facility], [address] [place], [place] [address]  Glucose, capillary     Status: Abnormal   Collection Time: 05/11/2019  4:27 PM  Result Value Ref Range   Glucose-Capillary 162 (H) 70 - 99 mg/dL  SARS CORONAVIRUS 2 (TAT 6-24 HRS) Nasopharyngeal     Status: None   Collection Time: 04/13/2019  5:12 PM   Specimen: Nasopharyngeal  Result Value Ref Range   SARS Coronavirus 2 NEGATIVE NEGATIVE    Comment: (NOTE) SARS-CoV-2 target nucleic acids are NOT DETECTED. The SARS-CoV-2 RNA is generally detectable in upper and lower respiratory specimens during the acute phase of infection. Negative results do not preclude SARS-CoV-2 infection, do not rule out co-infections with other pathogens, and should not be used as the sole basis for treatment or other patient management decisions. Negative results must be combined with clinical observations, patient history, and epidemiological information. The expected result is Negative. Fact Sheet for Patients: HairSlick.no Fact Sheet for Healthcare Providers: quierodirigir.com This test is not yet approved or cleared by the [place] FDA and  has been authorized for detection and/or diagnosis of SARS-CoV-2 by FDA under an Emergency Use Authorization (EUA). This EUA will remain  in effect (meaning this test can be used) for the duration of the COVID-19 declaration under Section 56 4(b)(1) of the Act,  21 U.S.C. section 360bbb-3(b)(1), unless the authorization is terminated or revoked sooner. Performed at [facility], [place] [address] [place], [place] [address]   MRSA PCR Screening     Status: None   Collection Time: 04/29/2019  6:32 PM   Specimen: Nasopharyngeal  Result Value Ref Range   MRSA by PCR NEGATIVE NEGATIVE    Comment:        The GeneXpert MRSA Assay (FDA approved for NASAL specimens only), is one component of  a comprehensive MRSA colonization surveillance program. It is not intended to diagnose MRSA infection nor to guide or monitor treatment for MRSA infections. Performed at [facility], [place] [address] [place], [place] [address]   Glucose, capillary     Status: Abnormal   Collection Time: 04/11/2019  8:45 PM  Result Value Ref Range   Glucose-Capillary 145 (H) 70 - 99 mg/dL    No results found.  ROS chart review due to patient's dementia.  Positive for osteoporosis, atrial fib, dementia, hypertension, diabetes.  Fall with ulnar styloid fracture and left comminuted intertrochanteric hip fracture angulated and displaced. Blood pressure (!) 104/43, pulse 93, temperature 97.9 F (36.6 C), temperature source Oral, resp. rate 16, SpO2 95 %. Physical Exam  Constitutional: She appears well-developed and well-nourished.  HENT:  Head: Normocephalic.  Eyes: Pupils are equal, round, and reactive to light.  Neck: No tracheal deviation present. No thyromegaly present.  Cardiovascular: Normal rate and regular rhythm.  Respiratory: Effort normal and breath sounds normal. No respiratory distress. She has no wheezes.  GI: Soft. Bowel sounds are normal.  Musculoskeletal:     Cervical back: Normal range of motion and neck supple.  Neurological: She is alert.  Confused oriented x2.  Skin: Skin is warm and dry.  Psychiatric:  Patient confused oriented x1.  Dementia.    Assessment/Plan: Left intertrochanteric hip fracture.  Comminuted closed displaced and angulated.  Plan trochanteric nail Wednesday 12:30 PM.  Call placed to son a message left to obtain informed consent.  I discussed plan with patient however she has  confusion.  Patient had some increased risk due to other medical problems, COPD, confusion, hypertension, history of atrial fibrillation, diabetes.  Patient will be 50% weightbearing likely after the procedure and work on transfers to recliner wheelchair for 6 weeks and then she should  be able to resume walking activity.  My cell [phone]  Marybelle Killings 05/06/2019, 8:11 AM

## 2019-05-07 ENCOUNTER — Inpatient Hospital Stay (HOSPITAL_COMMUNITY): Payer: Medicare Other | Admitting: Certified Registered"

## 2019-05-07 ENCOUNTER — Inpatient Hospital Stay (HOSPITAL_COMMUNITY): Payer: Medicare Other

## 2019-05-07 ENCOUNTER — Encounter (HOSPITAL_COMMUNITY): Payer: Self-pay | Admitting: Internal Medicine

## 2019-05-07 ENCOUNTER — Encounter (HOSPITAL_COMMUNITY): Admission: AD | Disposition: E | Payer: Self-pay | Source: Other Acute Inpatient Hospital | Attending: Internal Medicine

## 2019-05-07 HISTORY — PX: INTRAMEDULLARY (IM) NAIL INTERTROCHANTERIC: SHX5875

## 2019-05-07 LAB — ABO/RH: ABO/RH(D): A POS

## 2019-05-07 LAB — GLUCOSE, CAPILLARY
Glucose-Capillary: 85 mg/dL (ref 70–99)
Glucose-Capillary: 77 mg/dL (ref 70–99)
Glucose-Capillary: 86 mg/dL (ref 70–99)
Glucose-Capillary: 105 mg/dL — ABNORMAL HIGH (ref 70–99)
Glucose-Capillary: 182 mg/dL — ABNORMAL HIGH (ref 70–99)

## 2019-05-07 LAB — BASIC METABOLIC PANEL
Anion gap: 9 (ref 5–15)
BUN: 23 mg/dL (ref 8–23)
CO2: 26 mmol/L (ref 22–32)
Calcium: 8.9 mg/dL (ref 8.9–10.3)
Chloride: 105 mmol/L (ref 98–111)
Creatinine, Ser: 1.15 mg/dL — ABNORMAL HIGH (ref 0.44–1.00)
GFR calc Af Amer: 49 mL/min — ABNORMAL LOW (ref >60)
GFR calc non Af Amer: 42 mL/min — ABNORMAL LOW (ref >60)
Glucose, Bld: 91 mg/dL (ref 70–99)
Potassium: 4.5 mmol/L (ref 3.5–5.1)
Sodium: 140 mmol/L (ref 135–145)

## 2019-05-07 LAB — CBC
HCT: 30.3 % — ABNORMAL LOW (ref 36.0–46.0)
Hemoglobin: 9.3 g/dL — ABNORMAL LOW (ref 12.0–15.0)
MCH: 27.6 pg (ref 26.0–34.0)
MCHC: 30.7 g/dL (ref 30.0–36.0)
MCV: 89.9 fL (ref 80.0–100.0)
Platelets: 164 10*3/uL (ref 150–400)
RBC: 3.37 MIL/uL — ABNORMAL LOW (ref 3.87–5.11)
RDW: 14.2 % (ref 11.5–15.5)
WBC: 11.6 10*3/uL — ABNORMAL HIGH (ref 4.0–10.5)
nRBC: 0 % (ref 0.0–0.2)

## 2019-05-07 LAB — PREPARE RBC (CROSSMATCH)

## 2019-05-07 SURGERY — FIXATION, FRACTURE, INTERTROCHANTERIC, WITH INTRAMEDULLARY ROD
Anesthesia: General | Laterality: Left

## 2019-05-07 MED ORDER — BUPIVACAINE HCL (PF) 0.25 % IJ SOLN
INTRAMUSCULAR | Status: DC | PRN
  Administered 2019-05-07: 20 mL

## 2019-05-07 MED ORDER — DEXAMETHASONE SODIUM PHOSPHATE 10 MG/ML IJ SOLN
INTRAMUSCULAR | Status: AC
  Filled 2019-05-07: qty 1

## 2019-05-07 MED ORDER — ONDANSETRON HCL 4 MG/2ML IJ SOLN
INTRAMUSCULAR | Status: AC
  Filled 2019-05-07: qty 2

## 2019-05-07 MED ORDER — ROCURONIUM BROMIDE 10 MG/ML (PF) SYRINGE
PREFILLED_SYRINGE | INTRAVENOUS | Status: DC | PRN
  Administered 2019-05-07: 40 mg via INTRAVENOUS

## 2019-05-07 MED ORDER — PROPOFOL 10 MG/ML IV BOLUS
INTRAVENOUS | Status: AC
  Filled 2019-05-07: qty 20

## 2019-05-07 MED ORDER — FENTANYL CITRATE (PF) 250 MCG/5ML IJ SOLN
INTRAMUSCULAR | Status: AC
  Filled 2019-05-07: qty 5

## 2019-05-07 MED ORDER — EPINEPHRINE PF 1 MG/ML IJ SOLN
INTRAMUSCULAR | Status: AC
  Filled 2019-05-07: qty 1

## 2019-05-07 MED ORDER — ACETAMINOPHEN 500 MG PO TABS: 1000.0000 mg | ORAL_TABLET | Freq: Once | ORAL | Status: DC | PRN

## 2019-05-07 MED ORDER — LIDOCAINE 2% (20 MG/ML) 5 ML SYRINGE
INTRAMUSCULAR | Status: DC | PRN
  Administered 2019-05-07: 40 mg via INTRAVENOUS

## 2019-05-07 MED ORDER — ACETAMINOPHEN 160 MG/5ML PO SOLN: 1000.0000 mg | Freq: Once | ORAL | Status: DC | PRN

## 2019-05-07 MED ORDER — LIDOCAINE 2% (20 MG/ML) 5 ML SYRINGE
INTRAMUSCULAR | Status: AC
  Filled 2019-05-07: qty 5

## 2019-05-07 MED ORDER — ALBUMIN HUMAN 5 % IV SOLN: INTRAVENOUS | Status: DC | PRN

## 2019-05-07 MED ORDER — METOCLOPRAMIDE HCL 5 MG PO TABS: 5.0000 mg | ORAL_TABLET | Freq: Three times a day (TID) | ORAL | Status: DC | PRN

## 2019-05-07 MED ORDER — FENTANYL CITRATE (PF) 100 MCG/2ML IJ SOLN
INTRAMUSCULAR | Status: DC | PRN
  Administered 2019-05-07 (×2): 50 ug via INTRAVENOUS

## 2019-05-07 MED ORDER — OXYCODONE HCL 5 MG PO TABS: 5.0000 mg | ORAL_TABLET | Freq: Once | ORAL | Status: DC | PRN

## 2019-05-07 MED ORDER — PROPOFOL 10 MG/ML IV BOLUS
INTRAVENOUS | Status: DC | PRN
  Administered 2019-05-07: 40 mg via INTRAVENOUS

## 2019-05-07 MED ORDER — ONDANSETRON HCL 4 MG/2ML IJ SOLN: 4.0000 mg | Freq: Four times a day (QID) | INTRAMUSCULAR | Status: DC | PRN

## 2019-05-07 MED ORDER — FENTANYL CITRATE (PF) 100 MCG/2ML IJ SOLN: 25.0000 ug | INTRAMUSCULAR | Status: DC | PRN

## 2019-05-07 MED ORDER — PHENYLEPHRINE 40 MCG/ML (10ML) SYRINGE FOR IV PUSH (FOR BLOOD PRESSURE SUPPORT): PREFILLED_SYRINGE | INTRAVENOUS | Status: DC | PRN

## 2019-05-07 MED ORDER — METOCLOPRAMIDE HCL 5 MG/ML IJ SOLN: 5.0000 mg | Freq: Three times a day (TID) | INTRAMUSCULAR | Status: DC | PRN

## 2019-05-07 MED ORDER — PHENYLEPHRINE HCL-NACL 10-0.9 MG/250ML-% IV SOLN
INTRAVENOUS | Status: DC | PRN
  Administered 2019-05-07: 40 ug/min via INTRAVENOUS

## 2019-05-07 MED ORDER — ONDANSETRON HCL 4 MG PO TABS: 4.0000 mg | ORAL_TABLET | Freq: Four times a day (QID) | ORAL | Status: DC | PRN

## 2019-05-07 MED ORDER — ONDANSETRON HCL 4 MG/2ML IJ SOLN
INTRAMUSCULAR | Status: DC | PRN
  Administered 2019-05-07: 4 mg via INTRAVENOUS

## 2019-05-07 MED ORDER — BUPIVACAINE HCL (PF) 0.25 % IJ SOLN
INTRAMUSCULAR | Status: AC
  Filled 2019-05-07: qty 30

## 2019-05-07 MED ORDER — OXYCODONE HCL 5 MG/5ML PO SOLN: 5.0000 mg | Freq: Once | ORAL | Status: DC | PRN

## 2019-05-07 MED ORDER — SUGAMMADEX SODIUM 200 MG/2ML IV SOLN
INTRAVENOUS | Status: DC | PRN
  Administered 2019-05-07: 140 mg via INTRAVENOUS

## 2019-05-07 MED ORDER — PHENYLEPHRINE HCL (PRESSORS) 10 MG/ML IV SOLN
INTRAVENOUS | Status: DC | PRN
  Administered 2019-05-07: 120 ug via INTRAVENOUS
  Administered 2019-05-07: 160 ug via INTRAVENOUS
  Administered 2019-05-07: 40 ug via INTRAVENOUS
  Administered 2019-05-07: 80 ug via INTRAVENOUS

## 2019-05-07 MED ORDER — ACETAMINOPHEN 10 MG/ML IV SOLN: 1000.0000 mg | Freq: Once | INTRAVENOUS | Status: DC | PRN

## 2019-05-07 MED ORDER — MENTHOL 3 MG MT LOZG: 1.0000 | LOZENGE | OROMUCOSAL | Status: DC | PRN

## 2019-05-07 MED ORDER — PHENOL 1.4 % MT LIQD: 1.0000 | OROMUCOSAL | Status: DC | PRN

## 2019-05-07 MED ORDER — 0.9 % SODIUM CHLORIDE (POUR BTL) OPTIME
TOPICAL | Status: DC | PRN
  Administered 2019-05-07: 1000 mL

## 2019-05-07 MED ORDER — ROCURONIUM BROMIDE 10 MG/ML (PF) SYRINGE
PREFILLED_SYRINGE | INTRAVENOUS | Status: AC
  Filled 2019-05-07: qty 10

## 2019-05-07 MED ORDER — DOCUSATE SODIUM 100 MG PO CAPS
100.0000 mg | ORAL_CAPSULE | Freq: Two times a day (BID) | ORAL | Status: DC
  Administered 2019-05-08 – 2019-05-09 (×2): 100 mg via ORAL

## 2019-05-07 MED ORDER — DEXAMETHASONE SODIUM PHOSPHATE 10 MG/ML IJ SOLN
INTRAMUSCULAR | Status: DC | PRN
  Administered 2019-05-07: 5 mg via INTRAVENOUS

## 2019-05-07 MED ORDER — ASPIRIN EC 325 MG PO TBEC
325.0000 mg | DELAYED_RELEASE_TABLET | Freq: Every day | ORAL | Status: DC
  Administered 2019-05-09 – 2019-05-13 (×5): 325 mg via ORAL
  Filled 2019-05-07 (×6): qty 1

## 2019-05-07 SURGICAL SUPPLY — 41 items
BIT DRILL CANN LG 4.3MM (BIT) IMPLANT
BNDG COHESIVE 4X5 TAN STRL (GAUZE/BANDAGES/DRESSINGS) ×3 IMPLANT
CANISTER SUCT 3000ML PPV (MISCELLANEOUS) ×3 IMPLANT
COVER MAYO STAND STRL (DRAPES) ×1 IMPLANT
COVER PERINEAL POST (MISCELLANEOUS) ×3 IMPLANT
COVER SURGICAL LIGHT HANDLE (MISCELLANEOUS) ×3 IMPLANT
COVER WAND RF STERILE (DRAPES) ×3 IMPLANT
DRAPE C-ARM 42X72 X-RAY (DRAPES) ×3 IMPLANT
DRAPE STERI IOBAN 125X83 (DRAPES) ×3 IMPLANT
DRILL BIT CANN LG 4.3MM (BIT) ×3
DRSG MEPILEX BORDER 4X8 (GAUZE/BANDAGES/DRESSINGS) ×2 IMPLANT
DRSG PAD ABDOMINAL 8X10 ST (GAUZE/BANDAGES/DRESSINGS) ×1 IMPLANT
DURAPREP 26ML APPLICATOR (WOUND CARE) ×3 IMPLANT
ELECT REM PT RETURN 9FT ADLT (ELECTROSURGICAL) ×3
ELECTRODE REM PT RTRN 9FT ADLT (ELECTROSURGICAL) ×1 IMPLANT
EVACUATOR 1/8 PVC DRAIN (DRAIN) IMPLANT
GAUZE SPONGE 4X4 12PLY STRL (GAUZE/BANDAGES/DRESSINGS) ×1 IMPLANT
GAUZE XEROFORM 1X8 LF (GAUZE/BANDAGES/DRESSINGS) ×1 IMPLANT
GLOVE BIOGEL PI IND STRL 8 (GLOVE) ×2 IMPLANT
GLOVE BIOGEL PI INDICATOR 8 (GLOVE) ×4
GLOVE ORTHO TXT STRL SZ7.5 (GLOVE) ×6 IMPLANT
GOWN STRL REUS W/ TWL LRG LVL3 (GOWN DISPOSABLE) ×2 IMPLANT
GOWN STRL REUS W/TWL 2XL LVL3 (GOWN DISPOSABLE) ×3 IMPLANT
GOWN STRL REUS W/TWL LRG LVL3 (GOWN DISPOSABLE) ×4
GUIDEPIN 3.2X17.5 THRD DISP (PIN) ×2 IMPLANT
HFN 125 DEG 11MM X 180MM (Orthopedic Implant) ×2 IMPLANT
HIP FRA NAIL LAG SCREW 10.5X90 (Orthopedic Implant) ×3 IMPLANT
KIT BASIN OR (CUSTOM PROCEDURE TRAY) ×3 IMPLANT
KIT TURNOVER KIT B (KITS) ×3 IMPLANT
MANIFOLD NEPTUNE II (INSTRUMENTS) ×1 IMPLANT
NS IRRIG 1000ML POUR BTL (IV SOLUTION) ×3 IMPLANT
PACK GENERAL/GYN (CUSTOM PROCEDURE TRAY) ×3 IMPLANT
PAD ARMBOARD 7.5X6 YLW CONV (MISCELLANEOUS) ×6 IMPLANT
SCREW BONE CORTICAL 5.0X38 (Screw) ×2 IMPLANT
SCREW LAG HIP FRA NAIL 10.5X90 (Orthopedic Implant) IMPLANT
STAPLER VISISTAT 35W (STAPLE) ×3 IMPLANT
SUT VIC AB 0 CT1 27 (SUTURE) ×2
SUT VIC AB 0 CT1 27XBRD ANBCTR (SUTURE) ×1 IMPLANT
SUT VIC AB 2-0 CT1 27 (SUTURE) ×4
SUT VIC AB 2-0 CT1 TAPERPNT 27 (SUTURE) ×2 IMPLANT
WATER STERILE IRR 1000ML POUR (IV SOLUTION) ×1 IMPLANT

## 2019-05-07 NOTE — Progress Notes (Signed)
Report given to [facility] in [place] [facility]

## 2019-05-07 NOTE — Interval H&P Note (Signed)
History and Physical Interval Note:  04/28/2019 12:36 PM  Rhonda Hamilton  has presented today for surgery, with the diagnosis of left intertrochanteric hip fracture.  The various methods of treatment have been discussed with the patient and family. After consideration of risks, benefits and other options for treatment, the patient has consented to  Procedure(s): left trochanteric nail for intertrochanteric hip fracture (Left) as a surgical intervention.  The patient's history has been reviewed, patient examined, no change in status, stable for surgery.  I have reviewed the patient's chart and labs.  Questions were answered to the patient's satisfaction.     Eldred Manges

## 2019-05-07 NOTE — Op Note (Signed)
Preop diagnosis: Comminuted left intertrochanteric hip fracture.  Closed  Postop diagnosis: Same  Procedure: Left trochanteric nail fixation of comminuted left intertrochanteric hip fracture.  Surgeon: Annell Greening, MD  Anesthesia: General.  Assistant: Zonia Kief, PA-C medically necessary and present for the entire procedure.  EBL 200 cc  Implants Zimmer recon hip fracture nail Affixis 11 x 180 mm length.  90 mm lag screw.  38 mm interlock distal screw.  Procedure: After induction anesthesia Ancef prophylaxis patient was placed on the Hana table after boot was applied traction internal rotation distraction well leg holder C-arm was brought in and reduction was adjusted once it was in good position prepping with DuraPrep.  Timeout procedure completed towels large shower curtain Steri-Drape was applied.  Incision was made proximal trochanter gluteus medius fascia was split tip was palpated initially the pin was drilled it would slide into the fracture site and went posteriorly.  Had repeat position it slightly more anterior impacted it with a hammer and then was able to drill it overreamed and then passed 11 x 180 nail down the fracture site rotating it as it advanced with good reduction.  Measured 90 reamed 92 mm.  Screw was placed.  Locked down approximately.  Distal interlock was a 38 mm screw placed final spot pictures were taken irrigation with saline solution.  Closure of deep fascia #1 Vicryl 2-0 Vicryl subtendinous tissue skin staple closure Marcaine infiltration postop dressing and transferred to care room.

## 2019-05-07 NOTE — Anesthesia Procedure Notes (Signed)
Procedure Name: Intubation Date/Time: 04/18/2019 12:59 PM Performed by: Barrington Ellison, CRNA Pre-anesthesia Checklist: Patient identified, Emergency Drugs available, Suction available and Patient being monitored Patient Re-evaluated:Patient Re-evaluated prior to induction Oxygen Delivery Method: Circle System Utilized Preoxygenation: Pre-oxygenation with 100% oxygen Induction Type: IV induction Ventilation: Mask ventilation without difficulty Laryngoscope Size: Mac and 3 Grade View: Grade I Tube type: Oral Tube size: 7.0 mm Number of attempts: 1 Airway Equipment and Method: Stylet and Oral airway Placement Confirmation: ETT inserted through vocal cords under direct vision,  positive ETCO2 and breath sounds checked- equal and bilateral Secured at: 21 cm Tube secured with: Tape Dental Injury: Teeth and Oropharynx as per pre-operative assessment

## 2019-05-07 NOTE — Transfer of Care (Signed)
Immediate Anesthesia Transfer of Care Note  Patient: Rhonda Hamilton  Procedure(s) Performed: left trochanteric nail for intertrochanteric hip fracture (Left )  Patient Location: PACU  Anesthesia Type:General  Level of Consciousness: drowsy and patient cooperative  Airway & Oxygen Therapy: Patient Spontanous Breathing and Patient connected to face mask oxygen  Post-op Assessment: Report given to RN  Post vital signs: Reviewed and stable  Last Vitals:  Vitals Value Taken Time  BP 112/61 04/12/2019 1412  Temp    Pulse 74 04/24/2019 1414  Resp 21 04/14/2019 1414  SpO2 100 % 04/26/2019 1414  Vitals shown include unvalidated device data.  Last Pain:  Vitals:   04/20/2019 0747  TempSrc: Oral  PainSc:          Complications: No apparent anesthesia complications

## 2019-05-07 NOTE — Anesthesia Preprocedure Evaluation (Signed)
Anesthesia Evaluation  Patient identified by MRN, date of birth, ID band Patient confused    Reviewed: Allergy & Precautions, NPO status , Patient's Chart, lab work & pertinent test results, Unable to perform ROS - Chart review only  History of Anesthesia Complications Negative for: history of anesthetic complications  Airway Mallampati: III  TM Distance: >3 FB Neck ROM: Full    Dental  (+) Dental Advisory Given   Pulmonary COPD, neg recent URI, former smoker,    breath sounds clear to auscultation       Cardiovascular hypertension, Pt. on medications and Pt. on home beta blockers + dysrhythmias Atrial Fibrillation  Rhythm:Irregular     Neuro/Psych PSYCHIATRIC DISORDERS Dementia negative neurological ROS     GI/Hepatic GERD  Medicated,  Endo/Other  diabetes  Renal/GU CRFRenal disease     Musculoskeletal   Abdominal   Peds  Hematology negative hematology ROS (+)   Anesthesia Other Findings   Reproductive/Obstetrics                             Anesthesia Physical Anesthesia Plan  ASA: III  Anesthesia Plan: General   Post-op Pain Management:    Induction: Intravenous  PONV Risk Score and Plan: 3 and Ondansetron and Dexamethasone  Airway Management Planned: Oral ETT  Additional Equipment: None  Intra-op Plan:   Post-operative Plan: Extubation in OR  Informed Consent:     Dental advisory given and History available from chart only  Plan Discussed with: CRNA and Surgeon  Anesthesia Plan Comments:         Anesthesia Quick Evaluation

## 2019-05-07 NOTE — Progress Notes (Signed)
PROGRESS NOTE    Rhonda Hamilton  UTM:[id number] DOB: Aug 14, 1927 DOA: 2019-05-20 PCP: Patient, No Pcp Per     Brief Narrative:  Patient is an 86 year old female resident at [facility] who had a fall that was followed by severe left hip pain and inability to walk.  Past medical history significant for dementia, diabetes, hypertension, osteoporosis, and COPD.  She was normally ambulatory with a walker.  It was found she had a left intertrochanteric hip fracture.  Wrist x-ray shows an ulnar styloid fracture.  She was transferred to [facility] for surgery.   New events last 24 hours / Subjective: Unremarkable past 24 hours.  Pain is well controlled.  Surgery today.  Assessment & Plan:   Principal Problem:   Closed intertrochanteric fracture of hip, left, initial encounter (HCC)             Appreciate ortho             Norco 5-325 mg, 1-2 tabs q 6 hrs prn moderate pain             Robaxin 500 mg every 6 hours as needed for muscle spasm             Morphine 0.5 mg every 2 hours as needed for severe pain             Appreciate dietician  Active Problems:   Traumatic closed fracture of ulnar styloid with minimal displacement, left, initial encounter             Splinting             Appreciate ortho             Pain management as above    Hypertension             Metoprolol 50 mg twice daily    Diabetes mellitus (HCC)             Sliding scale insulin with meals    Dementia (HCC)             Stable    CKD (chronic kidney disease), stage III             Monitor BMP    Atrial fibrillation, chronic (HCC)             Stable   Nutrition Problem: Increased nutrient needs Etiology: post-op healing   DVT prophylaxis: SCDs Code Status: DNR Family Communication: Son Greggory Stallion is POA, [phone] Disposition Plan: Resides at [facility], d/c to rehab vs assisted living pending progress; barriers to d/c would be if she requires rehab and lack of beds   Consultants:    Ortho  Procedures:   Surg today  Antimicrobials:  Anti-infectives (From admission, onward)   Start     Dose/Rate Route Frequency Ordered Stop   04/29/2019 0600  ceFAZolin (ANCEF) IVPB 2g/100 mL premix     2 g 200 mL/hr over 30 Minutes Intravenous On call to O.R. 05/06/19 2052 05/08/19 0559        Objective: Vitals:   05/06/19 1914 05/04/2019 0357 04/11/2019 0747 04/25/2019 0941  BP: 124/81 126/78 134/67 134/67  Pulse: 99 91 (!) 104 (!) 104  Resp: 15 15 16    Temp: 97.8 F (36.6 C) 98.9 F (37.2 C) 98.7 F (37.1 C)   TempSrc:  Oral Oral   SpO2:  96% 99%   Weight:      Height:        Intake/Output Summary (Last 24 hours) at  04/22/2019 1218 Last data filed at 04/24/2019 1100 Gross per 24 hour  Intake 540 ml  Output 300 ml  Net 240 ml   Filed Weights   05/06/19 1138  Weight: 68.8 kg    Examination:  General exam: Appears calm and comfortable  Respiratory system: Clear to auscultation. Respiratory effort normal. No respiratory distress. No conversational dyspnea.  Cardiovascular system: S1 & S2 heard, RRR. +SEM. No pedal edema. Gastrointestinal system: Abdomen is nondistended, soft and nontender. Normal bowel sounds heard. Central nervous system: Alert and oriented. No focal neurological deficits. Speech clear.  Extremities: Symmetric in appearance  Skin: No rashes, lesions or ulcers on exposed skin  Psychiatry: Judgement and insight appear limited. Mood & affect appropriate.   Data Reviewed: I have personally reviewed following labs and imaging studies  CBC: Recent Labs  Lab 05/06/19 0736 04/28/2019 0137  WBC 10.6* 11.6*  HGB 9.6* 9.3*  HCT 30.4* 30.3*  MCV 89.9 89.9  PLT 172 614   Basic Metabolic Panel: Recent Labs  Lab 05/06/19 0736 04/24/2019 0137  NA 141 140  K 4.5 4.5  CL 105 105  CO2 29 26  GLUCOSE 96 91  BUN 22 23  CREATININE 1.17* 1.15*  CALCIUM 8.8* 8.9   CBG: Recent Labs  Lab 05/06/19 0838 05/06/19 1204 05/06/19 1735 04/13/2019 0639  04/22/2019 1144  GLUCAP 79 71 84 85 77    Recent Results (from the past 240 hour(s))  SARS CORONAVIRUS 2 (TAT 6-24 HRS) Nasopharyngeal     Status: None   Collection Time: 2019-05-30  5:12 PM   Specimen: Nasopharyngeal  Result Value Ref Range Status   SARS Coronavirus 2 NEGATIVE NEGATIVE Final    Comment: (NOTE) SARS-CoV-2 target nucleic acids are NOT DETECTED. The SARS-CoV-2 RNA is generally detectable in upper and lower respiratory specimens during the acute phase of infection. Negative results do not preclude SARS-CoV-2 infection, do not rule out co-infections with other pathogens, and should not be used as the sole basis for treatment or other patient management decisions. Negative results must be combined with clinical observations, patient history, and epidemiological information. The expected result is Negative. Fact Sheet for Patients: SugarRoll.be Fact Sheet for Healthcare Providers: https://www.woods-mathews.com/ This test is not yet approved or cleared by the [place] FDA and  has been authorized for detection and/or diagnosis of SARS-CoV-2 by FDA under an Emergency Use Authorization (EUA). This EUA will remain  in effect (meaning this test can be used) for the duration of the COVID-19 declaration under Section 56 4(b)(1) of the Act, 21 U.S.C. section 360bbb-3(b)(1), unless the authorization is terminated or revoked sooner. Performed at [facility], [place] [address] [place], [place] [address]   MRSA PCR Screening     Status: None   Collection Time: 05/30/19  6:32 PM   Specimen: Nasopharyngeal  Result Value Ref Range Status   MRSA by PCR NEGATIVE NEGATIVE Final    Comment:        The GeneXpert MRSA Assay (FDA approved for NASAL specimens only), is one component of a comprehensive MRSA colonization surveillance program. It is not intended to diagnose MRSA infection nor to guide or monitor treatment for MRSA  infections. Performed at [facility], [place] [address] [place], [place] [address]     Scheduled Meds: . chlorhexidine  60 mL Topical Once  . [MAR Hold] Chlorhexidine Gluconate Cloth  6 each Topical Daily  . [MAR Hold] docusate sodium  100 mg Oral BID  . [facility] Hold]  feeding supplement (ENSURE ENLIVE)  237 mL Oral BID BM  . [MAR Hold] insulin aspart  0-5 Units Subcutaneous QHS  . [MAR Hold] insulin aspart  0-9 Units Subcutaneous TID WC  . [MAR Hold] insulin glargine  15 Units Subcutaneous QHS  . [MAR Hold] metoprolol tartrate  50 mg Oral BID  . [MAR Hold] multivitamin with minerals  1 tablet Oral Daily  . [MAR Hold] pantoprazole  40 mg Oral Daily  . povidone-iodine  2 application Topical Once   Continuous Infusions: .  ceFAZolin (ANCEF) IV    . lactated ringers 75 mL/hr at 05/02/2019 1210  . [MAR Hold] methocarbamol (ROBAXIN) IV       LOS: 2 days    Time spent: 16 minutes   Sharlene Dory, DO [facility] 04/21/2019, 12:18 PM   Available via Epic secure chat 7am-7pm After these hours, please refer to coverage provider listed on [email or web address]

## 2019-05-07 NOTE — Progress Notes (Signed)
Orthopedic Tech Progress Note Patient Details:  Rhonda Hamilton 02/01/1928 [id number] OR RN called requesting a removable LEFT WRIST SPLINT  Ortho Devices Type of Ortho Device: Velcro wrist forearm splint Ortho Device/Splint Location: LUE Ortho Device/Splint Interventions: Other (comment)   Post Interventions Patient Tolerated: Other (comment) Instructions Provided: Other (comment)   Donald Pore 21-May-2019, 1:50 PM

## 2019-05-08 ENCOUNTER — Inpatient Hospital Stay (HOSPITAL_COMMUNITY): Payer: Medicare Other

## 2019-05-08 ENCOUNTER — Encounter: Payer: Self-pay | Admitting: *Deleted

## 2019-05-08 LAB — CBC
HCT: 29.3 % — ABNORMAL LOW (ref 36.0–46.0)
Hemoglobin: 8.9 g/dL — ABNORMAL LOW (ref 12.0–15.0)
MCH: 27.4 pg (ref 26.0–34.0)
MCHC: 30.4 g/dL (ref 30.0–36.0)
MCV: 90.2 fL (ref 80.0–100.0)
Platelets: 187 10*3/uL (ref 150–400)
RBC: 3.25 MIL/uL — ABNORMAL LOW (ref 3.87–5.11)
RDW: 13.9 % (ref 11.5–15.5)
WBC: 8.4 10*3/uL (ref 4.0–10.5)
nRBC: 0 % (ref 0.0–0.2)

## 2019-05-08 LAB — GLUCOSE, CAPILLARY
Glucose-Capillary: 142 mg/dL — ABNORMAL HIGH (ref 70–99)
Glucose-Capillary: 181 mg/dL — ABNORMAL HIGH (ref 70–99)
Glucose-Capillary: 164 mg/dL — ABNORMAL HIGH (ref 70–99)
Glucose-Capillary: 146 mg/dL — ABNORMAL HIGH (ref 70–99)
Glucose-Capillary: 151 mg/dL — ABNORMAL HIGH (ref 70–99)

## 2019-05-08 LAB — BASIC METABOLIC PANEL
Anion gap: 12 (ref 5–15)
BUN: 32 mg/dL — ABNORMAL HIGH (ref 8–23)
CO2: 22 mmol/L (ref 22–32)
Calcium: 8.6 mg/dL — ABNORMAL LOW (ref 8.9–10.3)
Chloride: 105 mmol/L (ref 98–111)
Creatinine, Ser: 1.32 mg/dL — ABNORMAL HIGH (ref 0.44–1.00)
GFR calc Af Amer: 41 mL/min — ABNORMAL LOW (ref >60)
GFR calc non Af Amer: 36 mL/min — ABNORMAL LOW (ref >60)
Glucose, Bld: 190 mg/dL — ABNORMAL HIGH (ref 70–99)
Potassium: 5 mmol/L (ref 3.5–5.1)
Sodium: 139 mmol/L (ref 135–145)

## 2019-05-08 NOTE — Progress Notes (Signed)
PROGRESS NOTE    Rhonda Hamilton  UYQ:[id number] DOB: [birth year] DOA: Jun 03, 2019 PCP: Patient, No Pcp Per     Brief Narrative:  Patient is an 86 year old female resident at [facility] who had a fall that was followed by severe left hip pain and inability to walk. Past medical history significant for dementia, diabetes, hypertension, osteoporosis, and COPD. She was normally ambulatory with a walker. It was found she had a left intertrochanteric hip fracture. Wrist x-ray shows an ulnar styloid fracture. She was transferred to [facility] for surgery.   New events last 24 hours / Subjective: Had surgery yesterday. Pain is mainly in abd region.  She does not remember when her last bowel movement was.  No nausea or vomiting.  Poor appetite reported per staff.  Assessment & Plan:   Principal Problem: Closed intertrochanteric fracture of hip, left, initial encounter ([place]) Appreciate ortho Norco 5-325 mg, 1-2 tabs q 6 hrs prn moderatepain Robaxin 500 mg every 6 hours as needed for muscle spasm Morphine 0.5 mg every 2 hours as needed for severe pain Appreciate dietician  PT  Active Problems: Traumatic closed fracture of ulnar styloid with minimal displacement, left, initial encounter Splinting Appreciate ortho Pain management as above  Constipation  Likely source of her abdominal pain  She has Dulcolax, MiraLAX, and Colace ordered, would recommend giving in that order as needed  Hypertension Metoprolol 50 mg twice daily  Diabetes mellitus ([place]) Sliding scale insulin with meals  Dementia ([place]) Stable  CKD (chronic kidney disease), stage III Monitor BMP  Atrial fibrillation, chronic (HCC) Stable  Nutrition Problem: Increased nutrient needs Etiology: post-op healing   DVT prophylaxis: Scds Code  Status: DNR Family Communication: Son, Iona Beard was updated today Disposition Plan: Resides at [place], d/c to SNF pending her progress, barriers to d/c include bed availability  Consultants:   Ortho  Procedures:   ORIF L hip 04/25/2019  Antimicrobials:  Anti-infectives (From admission, onward)   Start     Dose/Rate Route Frequency Ordered Stop   04/21/2019 0600  ceFAZolin (ANCEF) IVPB 2g/100 mL premix     2 g 200 mL/hr over 30 Minutes Intravenous On call to O.R. 05/06/19 2052 04/16/2019 1309       Objective: Vitals:   04/21/2019 1939 05/08/19 0018 05/08/19 0254 05/08/19 0800  BP: (!) 131/118 (!) 142/79 135/71 137/73  Pulse: 98 79 (!) 103 81  Resp:  17  17  Temp: 98.1 F (36.7 C) 98.2 F (36.8 C) 98.3 F (36.8 C) 98.5 F (36.9 C)  TempSrc: Oral Oral Oral Oral  SpO2: 95% 95% 96% 96%  Weight:      Height:        Intake/Output Summary (Last 24 hours) at 05/08/2019 1243 Last data filed at 05/08/2019 0500 Gross per 24 hour  Intake 970 ml  Output 400 ml  Net 570 ml   Filed Weights   05/06/19 1138  Weight: 68.8 kg    Examination:  General exam: Appears calm and comfortable  Respiratory system: Clear to auscultation. Respiratory effort normal. No respiratory distress. No conversational dyspnea.  Cardiovascular system: S1 & S2 heard, irreg irreg rhythm, reg rate. +SEM. No pedal edema. Gastrointestinal system: Abdomen is nondistended, soft and diffusely tender to palpation, worse in the left lower quadrant region. Normal bowel sounds heard. Central nervous system: No focal deficits noted Extremities: Symmetric in appearance  Skin: No rashes, lesions or ulcers on exposed skin  Psychiatry: Judgement and insight appear limited. Mood & affect appropriate.   Data Reviewed:  I have personally reviewed following labs and imaging studies  CBC: Recent Labs  Lab 05/06/19 0736 04/27/2019 0137 05/08/19 0139  WBC 10.6* 11.6* 8.4  HGB 9.6* 9.3* 8.9*  HCT 30.4* 30.3* 29.3*  MCV 89.9  89.9 90.2  PLT 172 164 187   Basic Metabolic Panel: Recent Labs  Lab 05/06/19 0736 04/19/2019 0137 05/08/19 0139  NA 141 140 139  K 4.5 4.5 5.0  CL 105 105 105  CO2 29 26 22   GLUCOSE 96 91 190*  BUN 22 23 32*  CREATININE 1.17* 1.15* 1.32*  CALCIUM 8.8* 8.9 8.6*   HbA1C: Recent Labs    05/16/2019 1617  HGBA1C 9.1*   CBG: Recent Labs  Lab 04/17/2019 1428 05/01/2019 1628 05/09/2019 2021 05/08/19 0628 05/08/19 1133  GLUCAP 86 105* 182* 181* 164*   Recent Results (from the past 240 hour(s))  SARS CORONAVIRUS 2 (TAT 6-24 HRS) Nasopharyngeal     Status: None   Collection Time: May 16, 2019  5:12 PM   Specimen: Nasopharyngeal  Result Value Ref Range Status   SARS Coronavirus 2 NEGATIVE NEGATIVE Final    Comment: (NOTE) SARS-CoV-2 target nucleic acids are NOT DETECTED. The SARS-CoV-2 RNA is generally detectable in upper and lower respiratory specimens during the acute phase of infection. Negative results do not preclude SARS-CoV-2 infection, do not rule out co-infections with other pathogens, and should not be used as the sole basis for treatment or other patient management decisions. Negative results must be combined with clinical observations, patient history, and epidemiological information. The expected result is Negative. Fact Sheet for Patients: 04/26/2019 Fact Sheet for Healthcare Providers: HairSlick.no This test is not yet approved or cleared by the quierodirigir.com FDA and  has been authorized for detection and/or diagnosis of SARS-CoV-2 by FDA under an Emergency Use Authorization (EUA). This EUA will remain  in effect (meaning this test can be used) for the duration of the COVID-19 declaration under Section 56 4(b)(1) of the Act, 21 U.S.C. section 360bbb-3(b)(1), unless the authorization is terminated or revoked sooner. Performed at [facility], [address] [place], [place]   MRSA PCR  Screening     Status: None   Collection Time: May 16, 2019  6:32 PM   Specimen: Nasopharyngeal  Result Value Ref Range Status   MRSA by PCR NEGATIVE NEGATIVE Final    Comment:        The GeneXpert MRSA Assay (FDA approved for NASAL specimens only), is one component of a comprehensive MRSA colonization surveillance program. It is not intended to diagnose MRSA infection nor to guide or monitor treatment for MRSA infections. Performed at [facility], [address] [place], [place]       Radiology Studies: Pelvis Portable  Result Date: 04/11/2019 CLINICAL DATA:  Left hip ORIF EXAM: PORTABLE PELVIS 1-2 VIEWS COMPARISON:  05/04/2019 FINDINGS: Interval placement of ORIF hardware within the proximal left femur via a antegrade intramedullary nail with distal interlocking screw and proximal lag screw. Alignment at the femoral neck is improved. Lesser trochanteric fragment remains medially displaced. Hip joint alignment is maintained without dislocation. Bones are demineralized. Expected postoperative changes within the overlying soft tissues. Extensive vascular calcifications. IMPRESSION: Interval ORIF of the proximal left femur as above. No evidence of immediate postoperative complication. Improved alignment at the fracture site. Electronically Signed   By: 05/06/2019 D.O.   On: 04/19/2019 15:24   DG C-Arm 1-60 Min  Result Date: 04/23/2019 CLINICAL DATA:  LEFT femoral trochanteric nail  EXAM: DG C-ARM 1-60 MIN; OPERATIVE LEFT HIP WITH PELVIS FLUOROSCOPY TIME:  Fluoroscopy Time:  0 minutes 41 seconds Radiation Exposure Index (if provided by the fluoroscopic device): Not provided Number of Acquired Spot Images: 4 COMPARISON:  05/04/2019 FINDINGS: Images demonstrate placement of an IM nail with a compression screw at the proximal LEFT femoral intertrochanteric fracture. Improved alignment of fracture fragments since previous exam. Distal locking screw present. No dislocation or  bone destruction identified. Diffuse osseous demineralization. IMPRESSION: Post ORIF proximal LEFT femur. Electronically Signed   By: Ulyses Southward M.D.   On: 31-May-2019 14:04   DG HIP OPERATIVE UNILAT WITH PELVIS LEFT  Result Date: 31-May-2019 CLINICAL DATA:  LEFT femoral trochanteric nail EXAM: DG C-ARM 1-60 MIN; OPERATIVE LEFT HIP WITH PELVIS FLUOROSCOPY TIME:  Fluoroscopy Time:  0 minutes 41 seconds Radiation Exposure Index (if provided by the fluoroscopic device): Not provided Number of Acquired Spot Images: 4 COMPARISON:  05/04/2019 FINDINGS: Images demonstrate placement of an IM nail with a compression screw at the proximal LEFT femoral intertrochanteric fracture. Improved alignment of fracture fragments since previous exam. Distal locking screw present. No dislocation or bone destruction identified. Diffuse osseous demineralization. IMPRESSION: Post ORIF proximal LEFT femur. Electronically Signed   By: Ulyses Southward M.D.   On: 05-31-19 14:04      Scheduled Meds:  aspirin EC  325 mg Oral Q breakfast   docusate sodium  100 mg Oral BID   docusate sodium  100 mg Oral BID   feeding supplement (ENSURE ENLIVE)  237 mL Oral BID BM   insulin aspart  0-5 Units Subcutaneous QHS   insulin aspart  0-9 Units Subcutaneous TID WC   insulin glargine  15 Units Subcutaneous QHS   metoprolol tartrate  50 mg Oral BID   multivitamin with minerals  1 tablet Oral Daily   pantoprazole  40 mg Oral Daily   Continuous Infusions:  lactated ringers 75 mL/hr at May 31, 2019 1210   methocarbamol (ROBAXIN) IV       LOS: 3 days   Time spent: 15 minutes   Sharlene Dory, DO [facility] 05/08/2019, 12:43 PM   Available via Epic secure chat 7am-7pm After these hours, please refer to coverage provider listed on [email or web address]

## 2019-05-08 NOTE — TOC Initial Note (Signed)
Transition of Care [facility]) - Initial/Assessment Note    Patient Details  Name: Rhonda Hamilton MRN: [id number] Date of Birth: 09/22/1927  [facility] CM/SW Contact:    Lawerance Sabal, RN Phone Number: 05/08/2019, 3:52 PM  Clinical Narrative:           Spoke to patient's son, he states that patient is from [facility] [place], and he was under the understanding that she was going to [facility]. I verified with him that this is the facility that he would prefer for her to go to. Initiated the [employer] process, they requested clinicals. Osborne Casco CSW will follow up with sending in clinicals.   Please follow up w [facility] and fax out to [facility] Friday.           Rhonda Hamilton [id number]      Expected Discharge Plan: Skilled Nursing Facility Barriers to Discharge: Continued Medical Work up   Patient Goals and CMS Choice Patient states their goals for this hospitalization and ongoing recovery are:: to go to [email or web address] Compare Post Acute Care list provided to:: Other (Comment Required) Choice offered to / list presented to : Adult Children  Expected Discharge Plan and Services Expected Discharge Plan: Skilled Nursing Facility In-house Referral: Clinical Social Work Discharge Planning Services: CM Consult Post Acute Care Choice: Nursing Home Living arrangements for the past 2 months: Assisted Living Facility                                      Prior Living Arrangements/Services Living arrangements for the past 2 months: Assisted Living Facility   Patient language and need for interpreter reviewed:: Yes              Criminal Activity/Legal Involvement Pertinent to Current Situation/Hospitalization: No - Comment as needed  Activities of Daily Living   ADL Screening (condition at time of admission) Patient's cognitive ability adequate to safely complete daily activities?: No Is the patient deaf or have difficulty hearing?:  No Does the patient have difficulty seeing, even when wearing glasses/contacts?: No Does the patient have difficulty concentrating, remembering, or making decisions?: No Patient able to express need for assistance with ADLs?: Yes Does the patient have difficulty dressing or bathing?: Yes Independently performs ADLs?: No Communication: Independent Does the patient have difficulty walking or climbing stairs?: Yes Weakness of Legs: Left Weakness of Arms/Hands: Left  Permission Sought/Granted                  Emotional Assessment              Admission diagnosis:  Hip fracture (HCC) [S72.009A] Patient Active Problem List   Diagnosis Date Noted  . Closed intertrochanteric fracture of hip, left, initial encounter (HCC) 04/25/2019  . Traumatic closed fracture of ulnar styloid with minimal displacement, left, initial encounter 05/08/2019  . CKD (chronic kidney disease), stage III May 07, 2019  . Atrial fibrillation, chronic (HCC) 07-May-2019  . Hypertension   . Diabetes mellitus (HCC)   . Dementia (HCC)   . COPD (chronic obstructive pulmonary disease) (HCC)    PCP:  Patient, No Pcp Per Pharmacy:   [facility], [place] - [address] [place] [address] Phone: [phone] Fax: [phone]  [facility] [place], [place] - [address] [place] [address] Phone: [phone] Fax: [phone]  Social Determinants of Health (SDOH) Interventions    Readmission Risk Interventions No flowsheet data found.

## 2019-05-08 NOTE — Evaluation (Signed)
Occupational Therapy Evaluation Patient Details Name: Rhonda Hamilton MRN: [id number] DOB: 1927/07/24 Today's Date: 05/08/2019    History of Present Illness 86 yo admitted after fall at ALF with left ulnar fx treated with splint and left intertrochanteric fx s/p IM nail. PMhx: dementia, COPD, osteoporosis, HTN   Clinical Impression   Pt with decline in function and safety with ADLs and ADL mobility with impaired strength, balance and endurance. Pt with hx of dementia and live at [facility]F. Pt reports that se uses a RW and did not need assist for bathing, dressing and toileting. Pt currently requires mod - max A with bed mobility, min A with UB ADLs, max - total A with Lb ADLs, total A with toileting and mod - min A +2 for mobility, SPTs. Pt very pleasant and cooperative (pleasantly confused at times as she was not aware that she was in the hospital and that she believed she was sitting in a chair when she was in the bed). Pt would benefit from acute OT services to address impairments to maximize level of function and safety    Follow Up Recommendations  SNF    Equipment Recommendations  None recommended by OT    Recommendations for Other Services       Precautions / Restrictions Precautions Precautions: Fall Required Braces or Orthoses: Splint/Cast Splint/Cast: LUE Restrictions Weight Bearing Restrictions: Yes LUE Weight Bearing: Weight bear through elbow only LLE Weight Bearing: Partial weight bearing LLE Partial Weight Bearing Percentage or Pounds: 50      Mobility Bed Mobility Overal bed mobility: Needs Assistance Bed Mobility: Supine to Sit;Sit to Supine     Supine to sit: Mod assist Sit to supine: Max assist   General bed mobility comments: mod A with LEs to EOB and to elevate trunk, max A with LEs back onto bed  Transfers Overall transfer level: Needs assistance   Transfers: Sit to/from [employer] Transfers Sit to Stand: Mod assist;+2 physical  assistance Stand pivot transfers: Min assist;+2 physical assistance       General transfer comment: cues for hand placement    Balance Overall balance assessment: Needs assistance Sitting-balance support: Feet unsupported Sitting balance-Leahy Scale: Fair Sitting balance - Comments: pt able to sit EOB with minguard assist   Standing balance support: Bilateral upper extremity supported;During functional activity Standing balance-Leahy Scale: Poor Standing balance comment: bil UE support in standing                           ADL either performed or assessed with clinical judgement   ADL Overall ADL's : Needs assistance/impaired Eating/Feeding: Set up;Sitting;Bed level   Grooming: Wash/dry hands;Wash/dry face;Min guard;Sitting   Upper Body Bathing: Minimal assistance;Sitting   Lower Body Bathing: Maximal assistance   Upper Body Dressing : Minimal assistance;Sitting   Lower Body Dressing: Total assistance   Toilet Transfer: Moderate assistance;Minimal assistance;+2 for physical assistance;Stand-pivot;BSC;Cueing for safety;Cueing for sequencing   Toileting- Clothing Manipulation and Hygiene: Total assistance       Functional mobility during ADLs: Moderate assistance;Minimal assistance;+2 for physical assistance;Cueing for safety;Cueing for sequencing       Vision Patient Visual Report: No change from baseline       Perception     Praxis      Pertinent Vitals/Pain Pain Assessment: 0-10 Pain Score: 5  Pain Location: abdomen and LLE with movement Pain Descriptors / Indicators: Tender;Cramping;Sore Pain Intervention(s): Limited activity within patient's tolerance;Monitored during session;Repositioned  Hand Dominance Right   Extremity/Trunk Assessment Upper Extremity Assessment Upper Extremity Assessment: Generalized weakness;LUE deficits/detail LUE Deficits / Details: wrist splint LUE: Unable to fully assess due to pain;Unable to fully assess due  to immobilization   Lower Extremity Assessment Lower Extremity Assessment: Defer to PT evaluation LLE Deficits / Details: pt with limited tolerance for rOM and positioning due to pain/fear of pain   Cervical / Trunk Assessment Cervical / Trunk Assessment: Kyphotic   Communication Communication Communication: No difficulties   Cognition Arousal/Alertness: Awake/alert Behavior During Therapy: WFL for tasks assessed/performed Overall Cognitive Status: No family/caregiver present to determine baseline cognitive functioning                                 General Comments: pt with history of dementia with pt aware she fell or that she is in the hospital. Pt thought she was siting up in a chair when she was in bed upon OT arrival. Pt with decreased awareness of precautions, deficits and safety   General Comments       Exercises    Shoulder Instructions      Home Living Family/patient expects to be discharged to:: Assisted living Living Arrangements: Alone   Type of Home: Assisted living Home Access: Level entry           Bathroom Shower/Tub: Walk-in shower   Bathroom Toilet: Handicapped height     Home Equipment: Environmental consultant - 2 wheels          Prior Functioning/Environment Level of Independence: Independent with assistive device(s)        Comments: pt reports she was able to perform gait with walker and performed all ADLs/selfcare without assist.  iADLs        OT Problem List: Impaired balance (sitting and/or standing);Decreased strength;Decreased cognition;Decreased knowledge of precautions;Pain;Decreased safety awareness;Decreased activity tolerance;Decreased knowledge of use of DME or AE      OT Treatment/Interventions: Self-care/ADL training;DME and/or AE instruction;Therapeutic activities;Balance training;Therapeutic exercise;Patient/family education    OT Goals(Current goals can be found in the care plan section) Acute Rehab OT Goals Patient  Stated Goal: none stated OT Goal Formulation: With patient Time For Goal Achievement: 05/22/19 Potential to Achieve Goals: Good ADL Goals Pt Will Perform Grooming: with supervision;with set-up;sitting Pt Will Perform Upper Body Bathing: with min guard assist;with supervision;with set-up;sitting Pt Will Perform Lower Body Bathing: with mod assist;sitting/lateral leans Pt Will Perform Upper Body Dressing: with min guard assist;with supervision;with set-up;sitting Pt Will Transfer to Toilet: with mod assist;with min assist;stand pivot transfer;bedside commode Pt Will Perform Toileting - Clothing Manipulation and hygiene: with max assist;with mod assist;sit to/from stand  OT Frequency: Min 2X/week   Barriers to D/C:            Co-evaluation              AM-PAC OT "6 Clicks" Daily Activity     Outcome Measure Help from another person eating meals?: A Little Help from another person taking care of personal grooming?: A Little Help from another person toileting, which includes using toliet, bedpan, or urinal?: Total Help from another person bathing (including washing, rinsing, drying)?: A Lot Help from another person to put on and taking off regular upper body clothing?: A Little Help from another person to put on and taking off regular lower body clothing?: Total 6 Click Score: 13   End of Session Equipment Utilized During Treatment: Gait belt;Rolling walker;Other (comment)(BSC)  Activity Tolerance: No increased pain;Patient limited by fatigue Patient left: in bed;with call bell/phone within reach;with bed alarm set  OT Visit Diagnosis: Unsteadiness on feet (R26.81);Other abnormalities of gait and mobility (R26.89);Muscle weakness (generalized) (M62.81);History of falling (Z91.81);Other symptoms and signs involving cognitive function;Pain Pain - Right/Left: Left Pain - part of body: Arm;Hip;Leg                Time: 3437-3578 OT Time Calculation (min): 28 min Charges:  OT  General Charges $OT Visit: 1 Visit OT Evaluation $OT Eval Moderate Complexity: 1 Mod OT Treatments $Self Care/Home Management : 8-22 mins    Margaretmary Eddy [facility] 05/08/2019, 3:07 PM

## 2019-05-08 NOTE — Evaluation (Addendum)
Physical Therapy Evaluation Patient Details Name: Rhonda Hamilton MRN: [id number] DOB: [birth year] Today's Date: 05/08/2019   History of Present Illness  86 yo admitted after fall at ALF with left ulnar fx treated with splint and left intertrochanteric fx s/p IM nail. PMhx: dementia, COPD, osteoporosis, HTN  Clinical Impression  Pt reporting abdominal pain on arrival and not feeling well. Pt with limited reports of pain in LLE and no pain in LUE. Pt educated for weight bearing restrictions, use of Platform RW, transfers and mobility. Pt with decreased strength, transfers, activity tolerance and independence who will benefit from acute therapy to maximize mobility, safety and function. PT will require SNF for D/C. Pt encouraged to mobilize bed<>chair/BSC with nursing staff with [place].      Follow Up Recommendations SNF;Supervision/Assistance - 24 hour    Equipment Recommendations  (left platform RW)    Recommendations for Other Services OT consult     Precautions / Restrictions Precautions Precautions: Fall Required Braces or Orthoses: Splint/Cast Splint/Cast: LUE Restrictions Weight Bearing Restrictions: Yes LUE Weight Bearing: Weight bear through elbow only LLE Weight Bearing: Partial weight bearing LLE Partial Weight Bearing Percentage or Pounds: 50      Mobility  Bed Mobility Overal bed mobility: Needs Assistance Bed Mobility: Supine to Sit     Supine to sit: Mod assist     General bed mobility comments: mod assist to pivot toward right side of bed with cues for sequence and precautions with assist to move LLE.  Transfers Overall transfer level: Needs assistance   Transfers: Sit to/from Stand;Stand Pivot Transfers Sit to Stand: Mod assist;+2 physical assistance Stand pivot transfers: Min assist;+2 physical assistance       General transfer comment: mod +2 assist to rise from surface with cues for hand placement, right foot blocked, hand over hand assist to place  LUE on platform. Once in standing pt able to make short sequential steps to pivot to chair with cues for sequence and only min assist  Ambulation/Gait             General Gait Details: unable  Stairs            Wheelchair Mobility    Modified Rankin (Stroke Patients Only)       Balance Overall balance assessment: Needs assistance   Sitting balance-Leahy Scale: Fair Sitting balance - Comments: pt able to sit EOB with minguard assist   Standing balance support: Bilateral upper extremity supported Standing balance-Leahy Scale: Poor Standing balance comment: bil UE support in standing                             Pertinent Vitals/Pain Pain Assessment: 0-10 Pain Score: 4  Pain Location: abdomen and LLE with movement Pain Descriptors / Indicators: Tender;Cramping Pain Intervention(s): Limited activity within patient's tolerance;Monitored during session;Repositioned    Home Living Family/patient expects to be discharged to:: Assisted living Living Arrangements: Alone   Type of Home: Assisted living Home Access: Level entry       Home Equipment: Walker - 2 wheels      Prior Function Level of Independence: Independent with assistive device(s)         Comments: pt reports she was able to perform gait with walker and performed all ADLs without assist.  staff assisting for iADLs     Hand Dominance        Extremity/Trunk Assessment   Upper Extremity Assessment Upper Extremity Assessment: Generalized weakness  Lower Extremity Assessment Lower Extremity Assessment: Generalized weakness;LLE deficits/detail LLE Deficits / Details: pt with limited tolerance for rOM and positioning due to pain/fear of pain    Cervical / Trunk Assessment Cervical / Trunk Assessment: Kyphotic  Communication   Communication: No difficulties  Cognition Arousal/Alertness: Awake/alert Behavior During Therapy: WFL for tasks assessed/performed Overall Cognitive  Status: No family/caregiver present to determine baseline cognitive functioning                                 General Comments: pt with history of dementia with pt aware she fell and oriented to place. Pt with decreased awareness of precautions, deficits and safety      General Comments      Exercises Total Joint Exercises Long Arc Quad: AROM;AAROM;Right;Left;Seated;10 reps(AAROM on LLE) Marching in Standing: AROM;AAROM;Right;Left;Seated;10 reps(AAROM on LLE)   Assessment/Plan    PT Assessment Patient needs continued PT services  PT Problem List Decreased strength;Decreased mobility;Decreased safety awareness;Decreased activity tolerance;Decreased balance;Decreased knowledge of use of DME;Decreased cognition       PT Treatment Interventions DME instruction;Therapeutic exercise;Balance training;Functional mobility training;Cognitive remediation;Therapeutic activities;Patient/family education    PT Goals (Current goals can be found in the Care Plan section)  Acute Rehab PT Goals Patient Stated Goal: [id number] PT Goal Formulation: With patient Time For Goal Achievement: 05/22/19 Potential to Achieve Goals: Good    Frequency Min 4X/week   Barriers to discharge Decreased caregiver support      Co-evaluation               AM-PAC PT "6 Clicks" Mobility  Outcome Measure Help needed turning from your back to your side while in a flat bed without using bedrails?: A Lot Help needed moving from lying on your back to sitting on the side of a flat bed without using bedrails?: A Lot Help needed moving to and from a bed to a chair (including a wheelchair)?: A Lot Help needed standing up from a chair using your arms (e.g., wheelchair or bedside chair)?: A Lot Help needed to walk in hospital room?: Total Help needed climbing 3-5 steps with a railing? : Total 6 Click Score: 10    End of Session Equipment Utilized During Treatment: Gait belt Activity Tolerance: Patient  tolerated treatment well Patient left: in chair;with call bell/phone within reach;with chair alarm set Nurse Communication: Mobility status;Patient requests pain meds;Weight bearing status PT Visit Diagnosis: Other abnormalities of gait and mobility (R26.89);Difficulty in walking, not elsewhere classified (R26.2);History of falling (Z91.81)    Time: 8466-5993 PT Time Calculation (min) (ACUTE ONLY): 18 min   Charges:   PT Evaluation $PT Eval Moderate Complexity: 1 Mod          Britani Beattie P, PT Acute Rehabilitation Services Pager: [phone] Office: [phone]   Nga Rabon B Shemekia Patane 05/08/2019, 1:33 PM

## 2019-05-08 NOTE — NC FL2 (Signed)
[place] LEVEL OF CARE SCREENING TOOL     IDENTIFICATION  Patient Name: Rhonda Hamilton Birthdate: 04/25/1927 Sex: female Admission Date (Current Location): 04/19/2019  [facility] and [place] Number:  [employer] and Address:  [facility] [place] [place] [address], [place], [place] [address]      Provider Number: [id number]  Attending Physician Name and Address:  Shelda Pal,*  Relative Name and Phone Number:       Current Level of Care: Hospital Recommended Level of Care: [place] Prior Approval Number:    Date Approved/Denied:   PASRR Number: pending  Discharge Plan: SNF    Current Diagnoses: Patient Active Problem List   Diagnosis Date Noted  . Closed intertrochanteric fracture of hip, left, initial encounter ([place]) 05/08/2019  . Traumatic closed fracture of ulnar styloid with minimal displacement, left, initial encounter 04/17/2019  . CKD (chronic kidney disease), stage III 04/13/2019  . Atrial fibrillation, chronic ([place]) 05/10/2019  . Hypertension   . Diabetes mellitus ([place])   . Dementia ([place])   . COPD (chronic obstructive pulmonary disease) (HCC)     Orientation RESPIRATION BLADDER Height & Weight     Self  Normal Incontinent Weight: 68.8 kg Height:  5\' 4"  (162.6 cm)(from [facility] records on 05/04/19)  BEHAVIORAL SYMPTOMS/MOOD NEUROLOGICAL BOWEL NUTRITION STATUS      Incontinent Diet  AMBULATORY STATUS COMMUNICATION OF NEEDS Skin   Total Care Verbally Surgical wounds                       Personal Care Assistance Level of Assistance  Bathing, Feeding, Dressing Bathing Assistance: Limited assistance Feeding assistance: Limited assistance Dressing Assistance: Limited assistance     Functional Limitations Info  Sight, Hearing, Speech Sight Info: Adequate Hearing Info: Adequate Speech Info: Adequate    SPECIAL CARE FACTORS FREQUENCY  PT (By licensed PT), OT (By licensed OT)      PT Frequency: 5x week OT Frequency: 5x week            Contractures Contractures Info: Not present    Additional Factors Info  Code Status Code Status Info: DNR             Current Medications (05/08/2019):  This is the current hospital active medication list Current Facility-Administered Medications  Medication Dose Route Frequency Provider Last Rate Last Admin  . aspirin EC tablet 325 mg  325 mg Oral Q breakfast Benjiman Core M, PA-C      . bisacodyl (DULCOLAX) EC tablet 5 mg  5 mg Oral Daily PRN Karmen Bongo, MD   5 mg at 05/08/19 1235  . docusate sodium (COLACE) capsule 100 mg  100 mg Oral BID Karmen Bongo, MD   100 mg at 04/24/2019 2209  . docusate sodium (COLACE) capsule 100 mg  100 mg Oral BID Benjiman Core M, PA-C      . feeding supplement (ENSURE ENLIVE) (ENSURE ENLIVE) liquid 237 mL  237 mL Oral BID BM Shelda Pal, DO   237 mL at 05/08/19 1313  . HYDROcodone-acetaminophen (NORCO/VICODIN) 5-325 MG per tablet 1-2 tablet  1-2 tablet Oral Q6H PRN Karmen Bongo, MD   1 tablet at 04/21/2019 2037  . insulin aspart (novoLOG) injection 0-5 Units  0-5 Units Subcutaneous QHS Karmen Bongo, MD      . insulin aspart (novoLOG) injection 0-9 Units  0-9 Units Subcutaneous TID WC Karmen Bongo, MD   2 Units at 05/08/19 1157  .  insulin glargine (LANTUS) injection 15 Units  15 Units Subcutaneous QHS Jonah Blue, MD   15 Units at 05/06/2019 2210  . lactated ringers infusion   Intravenous Continuous Jonah Blue, MD 75 mL/hr at 05/09/2019 1210 Restarted at 05/08/2019 1400  . menthol-cetylpyridinium (CEPACOL) lozenge 3 mg  1 lozenge Oral PRN Naida Sleight, PA-C       Or  . phenol (CHLORASEPTIC) mouth spray 1 spray  1 spray Mouth/Throat PRN Naida Sleight, PA-C      . methocarbamol (ROBAXIN) tablet 500 mg  500 mg Oral Q6H PRN Jonah Blue, MD   500 mg at 04/22/2019 1743   Or  . methocarbamol (ROBAXIN) 500 mg in dextrose 5 % 50 mL IVPB  500 mg Intravenous Q6H PRN  Jonah Blue, MD      . metoCLOPramide (REGLAN) tablet 5-10 mg  5-10 mg Oral Q8H PRN Naida Sleight, PA-C       Or  . metoCLOPramide (REGLAN) injection 5-10 mg  5-10 mg Intravenous Q8H PRN Zonia Kief M, PA-C      . metoprolol tartrate (LOPRESSOR) tablet 50 mg  50 mg Oral BID Jonah Blue, MD   50 mg at 04/24/2019 2210  . morphine 2 MG/ML injection 0.5 mg  0.5 mg Intravenous Q2H PRN Jonah Blue, MD   0.5 mg at 05/03/2019 2207  . multivitamin with minerals tablet 1 tablet  1 tablet Oral Daily Sharlene Dory, DO   1 tablet at 05/06/19 1217  . ondansetron (ZOFRAN) tablet 4 mg  4 mg Oral Q6H PRN Naida Sleight, PA-C       Or  . ondansetron [facility]) injection 4 mg  4 mg Intravenous Q6H PRN Zonia Kief M, PA-C      . pantoprazole (PROTONIX) EC tablet 40 mg  40 mg Oral Daily Jonah Blue, MD   40 mg at 05/06/19 0906  . polyethylene glycol (MIRALAX / GLYCOLAX) packet 17 g  17 g Oral Daily PRN Jonah Blue, MD   17 g at 05/08/19 1237     Discharge Medications: Please see discharge summary for a list of discharge medications.  Relevant Imaging Results:  Relevant Lab Results:   Additional Information [id number]  Lawerance Sabal, RN

## 2019-05-08 NOTE — Progress Notes (Signed)
Nutrition Follow-up  RD working remotely.  DOCUMENTATION CODES:   Not applicable  INTERVENTION:   -Downgrade diet to dysphagia 2 (mechanical soft) for ease of intake -Continue MVI with minerals daily -Continue Ensure Enlive po BID, each supplement provides 350 kcal and 20 grams of protein -Continue Magic cup TID with meals, each supplement provides 290 kcal and 9 grams of protein  NUTRITION DIAGNOSIS:   Increased nutrient needs related to post-op healing as evidenced by estimated needs.  Ongoing  GOAL:   Patient will meet greater than or equal to 90% of their needs  Progressing   MONITOR:   PO intake, Supplement acceptance, Labs, Weight trends, Skin, I & O's  REASON FOR ASSESSMENT:   Consult Hip fracture protocol  ASSESSMENT:   Rhonda Hamilton is a 86 y.o. female with medical history significant of afib, dementia, HTN, and DM presenting with a fall.  Patient reports that she was in her "apartment" last night and she had a mechanical fall in the floor.  She is not hurting at this time.  She is oriented to person and place but otherwise is not able to provide significant history.  1/27- s/p Procedure: Left trochanteric nail fixation of comminuted left intertrochanteric hip fracture.  Reviewed I/O's: +420 ml x 24 hours and +538 ml since admission  UOP: 500 ml x 24 hours  Attempted to speak with pt via phone, however, no answer.   Noted meal intake continues to be poor (noted meal completion 25%). Pt advanced back to a heart healthy diet yesterday, however, pt with poor dentition and would better benefit from a mechanically altered diet. Per MAR, she is consuming her Ensure supplements.   Pt with poor oral intake and would benefit from nutrient dense supplement. One Ensure Enlive supplement provides 350 kcals, 20 grams protein, and 44-45 grams of carbohydrate vs one Glucerna shake supplement, which provides 220 kcals, 10 grams of protein, and 26 grams of carbohydrate.  Given pt's hx of DM, RD will continue to monitor PO intake, CBGS, and adjust supplement regimen as appropriate.   Per MD notes, plan to d/c to rehab vs ALF.   Labs reviewed: CBGS: 105-182 (inpatient orders for glycemic control are 0-5 units insulin aspart q HS, 0-9 units insulin aspart TID with meals, and 15 units insulin glargine daily).   Diet Order:   Diet Order            Diet Heart Room service appropriate? Yes; Fluid consistency: Thin  Diet effective now              EDUCATION NEEDS:   Education needs have been addressed  Skin:  Skin Assessment: Skin Integrity Issues: Skin Integrity Issues:: Incisions Incisions: closed lt hip  Last BM:  04/30/2019  Height:   Ht Readings from Last 1 Encounters:  05/06/19 5\' 4"  (1.626 m)    Weight:   Wt Readings from Last 1 Encounters:  05/06/19 68.8 kg    Ideal Body Weight:  54.5 kg  BMI:  Body mass index is 26.04 kg/m.  Estimated Nutritional Needs:   Kcal:  1550-1750  Protein:  70-85 grams  Fluid:  > 1.5 L    Rhonda Hamilton A. 05/08/19, RD, LDN, CDCES Registered Dietitian II Certified Diabetes Care and Education Specialist Pager: [phone] After hours Pager: [phone]

## 2019-05-08 NOTE — Progress Notes (Signed)
Subjective: Left hip doing ok.  C/o abdominal pain.  Patient not sure of last BM.  RN states that patient has poor appetite. Hx dementia.    Objective: Vital signs in last 24 hours: Temp:  [97.2 F (36.2 C)-98.5 F (36.9 C)] 98.5 F (36.9 C) (01/28 0800) Pulse Rate:  [68-103] 81 (01/28 0800) Resp:  [16-19] 17 (01/28 0800) BP: (112-154)/(61-118) 137/73 (01/28 0800) SpO2:  [93 %-100 %] 96 % (01/28 0800)  Intake/Output from previous day: 01/27 0701 - 01/28 0700 In: 970 [P.O.:120; I.V.:600; IV Piggyback:250] Out: 550 [Urine:500; Blood:50] Intake/Output this shift: No intake/output data recorded.  Recent Labs    05/06/19 0736 04/15/2019 0137 05/08/19 0139  HGB 9.6* 9.3* 8.9*   Recent Labs    04/17/2019 0137 05/08/19 0139  WBC 11.6* 8.4  RBC 3.37* 3.25*  HCT 30.3* 29.3*  PLT 164 187   Recent Labs    05/10/2019 0137 05/08/19 0139  NA 140 139  K 4.5 5.0  CL 105 105  CO2 26 22  BUN 23 32*  CREATININE 1.15* 1.32*  GLUCOSE 91 190*  CALCIUM 8.9 8.6*   No results for input(s): LABPT, INR in the last 72 hours.  Exam: Abdomen diffusely tender to palpation.  Some bleeding through hip dressing. Calf nontender. NVI.     Assessment/Plan: Will get stat KUB r/o ileus.  Advised RN to contact hospitalist. Hip stable.  Continue wrist splint.      Zonia Kief 05/08/2019, 12:06 PM

## 2019-05-09 LAB — BASIC METABOLIC PANEL
Anion gap: 10 (ref 5–15)
BUN: 45 mg/dL — ABNORMAL HIGH (ref 8–23)
CO2: 26 mmol/L (ref 22–32)
Calcium: 8.5 mg/dL — ABNORMAL LOW (ref 8.9–10.3)
Chloride: 105 mmol/L (ref 98–111)
Creatinine, Ser: 1.68 mg/dL — ABNORMAL HIGH (ref 0.44–1.00)
GFR calc Af Amer: 31 mL/min — ABNORMAL LOW (ref >60)
GFR calc non Af Amer: 27 mL/min — ABNORMAL LOW (ref >60)
Glucose, Bld: 134 mg/dL — ABNORMAL HIGH (ref 70–99)
Potassium: 4.9 mmol/L (ref 3.5–5.1)
Sodium: 141 mmol/L (ref 135–145)

## 2019-05-09 LAB — CBC
HCT: 28.2 % — ABNORMAL LOW (ref 36.0–46.0)
Hemoglobin: 8.8 g/dL — ABNORMAL LOW (ref 12.0–15.0)
MCH: 28 pg (ref 26.0–34.0)
MCHC: 31.2 g/dL (ref 30.0–36.0)
MCV: 89.8 fL (ref 80.0–100.0)
Platelets: 211 10*3/uL (ref 150–400)
RBC: 3.14 MIL/uL — ABNORMAL LOW (ref 3.87–5.11)
RDW: 14.4 % (ref 11.5–15.5)
WBC: 13.4 10*3/uL — ABNORMAL HIGH (ref 4.0–10.5)
nRBC: 0.1 % (ref 0.0–0.2)

## 2019-05-09 LAB — GLUCOSE, CAPILLARY
Glucose-Capillary: 99 mg/dL (ref 70–99)
Glucose-Capillary: 133 mg/dL — ABNORMAL HIGH (ref 70–99)
Glucose-Capillary: 170 mg/dL — ABNORMAL HIGH (ref 70–99)
Glucose-Capillary: 148 mg/dL — ABNORMAL HIGH (ref 70–99)

## 2019-05-09 MED ORDER — BISACODYL 10 MG RE SUPP
10.0000 mg | Freq: Once | RECTAL | Status: AC
  Administered 2019-05-09: 10 mg via RECTAL
  Filled 2019-05-09: qty 1

## 2019-05-09 MED ORDER — ASPIRIN 325 MG PO TBEC: 325.0000 mg | DELAYED_RELEASE_TABLET | Freq: Every day | ORAL | 0 refills | Status: AC

## 2019-05-09 MED ORDER — TRAMADOL HCL 50 MG PO TABS: 50.0000 mg | ORAL_TABLET | Freq: Three times a day (TID) | ORAL | 0 refills | Status: AC | PRN

## 2019-05-09 NOTE — Progress Notes (Signed)
RN provided son update on pt's status.

## 2019-05-09 NOTE — Progress Notes (Signed)
Patient refused nightly medications.

## 2019-05-09 NOTE — Progress Notes (Signed)
Physical Therapy Treatment Patient Details Name: Rhonda Hamilton MRN: [id number] DOB: Mar 21, 1928 Today's Date: 05/09/2019    History of Present Illness 86 yo admitted after fall at ALF with left ulnar fx treated with splint and left intertrochanteric fx s/p IM nail. PMhx: dementia, COPD, osteoporosis, HTN    PT Comments    Pt was able to progress OOB to recliner chair with use of platform RW. She was able to state WB precautions at beginning of session but required frequent cueing and assist to maintain WB precautions. During transfer pt was very fearful of falling and maintained a posterior lean. Once seated pt performed HEP. She was confused at times, frequently asking if staff was being truthful with her about her injuries and need for rehab. Required frequent reassurance that her injuries were real and she would need to go to SNF prior to return home. Patient would benefit from continued skilled PT to maximize functional independence and safety with mobility. Will continue to follow acutely.      Follow Up Recommendations  SNF;Supervision/Assistance - 24 hour     Equipment Recommendations  (left platform RW)    Recommendations for Other Services OT consult     Precautions / Restrictions Precautions Precautions: Fall Required Braces or Orthoses: Splint/Cast Splint/Cast: LUE Restrictions Weight Bearing Restrictions: Yes LUE Weight Bearing: Weight bear through elbow only LLE Weight Bearing: Partial weight bearing LLE Partial Weight Bearing Percentage or Pounds: 50    Mobility  Bed Mobility Overal bed mobility: Needs Assistance Bed Mobility: Supine to Sit;Sit to Supine     Supine to sit: Mod assist     General bed mobility comments: mod A with LEs to EOB and to elevate trunk  Transfers Overall transfer level: Needs assistance Equipment used: Left platform walker Transfers: Sit to/from Stand;Stand Pivot Transfers Sit to Stand: Mod assist;+2 physical assistance Stand  pivot transfers: +2 physical assistance;Mod assist       General transfer comment: Cues for hand placement and mod physical assist required to place L UE on platform. Pt fearful and with posterior lean throughout transfer, requiring mod A for balance.   Ambulation/Gait             General Gait Details: unable   Stairs             Wheelchair Mobility    Modified Rankin (Stroke Patients Only)       Balance Overall balance assessment: Needs assistance Sitting-balance support: Feet unsupported Sitting balance-Leahy Scale: Fair Sitting balance - Comments: pt able to sit EOB with minguard assist   Standing balance support: Bilateral upper extremity supported;During functional activity Standing balance-Leahy Scale: Poor Standing balance comment: bil UE support in standing                            Cognition Arousal/Alertness: Awake/alert Behavior During Therapy: WFL for tasks assessed/performed Overall Cognitive Status: No family/caregiver present to determine baseline cognitive functioning                                 General Comments: pt with history of dementia with pt aware she fell and that she was in the hospital. She kept stating she thought staff was lying to her and had to be reassured of her injuries on several occasions. She was able to recall WB precautions but unable to adhear to them. When placing call bell in pt's  hand she became defensive, stating she was being tied up. Again needed to be reassured that it was just a call bell and she was not tied to the chair, but to call for help when ever she wanted to get up.      Exercises Total Joint Exercises Ankle Circles/Pumps: AROM;Both;10 reps;Seated Quad Sets: AROM;Left;10 reps;Seated Short Arc Quad: AROM;Left;10 reps;Seated Heel Slides: AROM;Left;10 reps;Seated    General Comments        Pertinent Vitals/Pain Pain Assessment: Faces Faces Pain Scale: Hurts little  more Pain Location: back and LLE with movement Pain Descriptors / Indicators: Tender;Cramping;Sore Pain Intervention(s): Monitored during session;Limited activity within patient's tolerance;Repositioned    Home Living                      Prior Function            PT Goals (current goals can now be found in the care plan section) Acute Rehab PT Goals Patient Stated Goal: none stated PT Goal Formulation: With patient Time For Goal Achievement: 05/22/19 Potential to Achieve Goals: Good Progress towards PT goals: Progressing toward goals    Frequency    Min 4X/week      PT Plan Current plan remains appropriate    Co-evaluation              AM-PAC PT "6 Clicks" Mobility   Outcome Measure  Help needed turning from your back to your side while in a flat bed without using bedrails?: A Lot Help needed moving from lying on your back to sitting on the side of a flat bed without using bedrails?: A Lot Help needed moving to and from a bed to a chair (including a wheelchair)?: A Lot Help needed standing up from a chair using your arms (e.g., wheelchair or bedside chair)?: A Lot Help needed to walk in hospital room?: Total Help needed climbing 3-5 steps with a railing? : Total 6 Click Score: 10    End of Session Equipment Utilized During Treatment: Gait belt Activity Tolerance: Patient tolerated treatment well Patient left: in chair;with call bell/phone within reach;with chair alarm set;with nursing/sitter in room Nurse Communication: Mobility status PT Visit Diagnosis: Other abnormalities of gait and mobility (R26.89);Difficulty in walking, not elsewhere classified (R26.2);History of falling (Z91.81)     Time: 8937-3428 PT Time Calculation (min) (ACUTE ONLY): 26 min  Charges:  $Therapeutic Exercise: 8-22 mins $Therapeutic Activity: 8-22 mins                     Kallie Locks, [place] Pager [phone] Acute Rehab   Sheral Apley 05/09/2019, 10:29 AM

## 2019-05-09 NOTE — Progress Notes (Signed)
Pt refusing AM meds at this time. Stating "why are you guys telling me a story? Why are you treating me this way? You will meet God one day and he will be disappointed in you."

## 2019-05-09 NOTE — Progress Notes (Signed)
PROGRESS NOTE    Rhonda Hamilton  YBW:[id number] DOB: [birth year] DOA: 05/18/2019 PCP: Patient, No Pcp Per     Brief Narrative:  Patient is an 86 year old female resident at [facility] who had a fall that was followed by severe left hip pain and inability to walk. Past medical history significant for dementia, diabetes, hypertension, osteoporosis, and COPD. She was normally ambulatory with a walker. It was found she had a left intertrochanteric hip fracture. Wrist x-ray shows an ulnar styloid fracture. She was transferred to [facility] for surgery.  Patient had surgery in the left hip on 1/27.  It went well.  PT recommended SNF.  New events last 24 hours / Subjective: Still has not had a bowel movement.  Refusing some of the stool stimulating medications.  Having some distention.  Diet is poor.  Assessment & Plan: Principal Problem: Closed intertrochanteric fracture of hip, left, initial encounter (HCC) Appreciate ortho Norco 5-325 mg, 1-2 tabs q 6 hrs prn moderatepain Robaxin 500 mg every 6 hours as needed for muscle spasm Morphine 0.5 mg every 2 hours as needed for severe pain Appreciate dietician             PT  Active Problems:   AKI  Monitor BMP  Likely 2/2 poor appetite  Turned fluids up to 100 mL/hr  Traumatic closed fracture of ulnar styloid with minimal displacement, left, initial encounter Splinting Appreciate ortho Pain management as above  Constipation             Likely source of her abdominal pain             She has Dulcolax, MiraLAX, and Colace ordered, would recommend giving in that order as needed  Hypertension Metoprolol 50 mg twice daily  Diabetes mellitus (HCC) Sliding scale insulin with meals  Dementia (HCC) Stable  CKD (chronic kidney disease), stage III Monitor BMP  Atrial  fibrillation, chronic (HCC) Stable   Nutrition Problem: Increased nutrient needs Etiology: post-op healing   DVT prophylaxis: Scds Code Status: DNR Family Communication: Son, Greggory Stallion  Disposition Plan: Resides at [facility], d/c to SNF pending her progress, barriers to d/c include bed availability  Consultants:   Ortho  Procedures:   ORIF L hip 04/17/2019  Antimicrobials:  Anti-infectives (From admission, onward)   Start     Dose/Rate Route Frequency Ordered Stop   04/19/2019 0600  ceFAZolin (ANCEF) IVPB 2g/100 mL premix     2 g 200 mL/hr over 30 Minutes Intravenous On call to O.R. 05/06/19 2052 05/08/2019 1309        Objective: Vitals:   05/08/19 2051 05/08/19 2217 05/09/19 0303 05/09/19 0805  BP: 128/78 138/82 127/64 106/81  Pulse: 100 (!) 109 91 61  Resp:    16  Temp: 97.8 F (36.6 C)  (!) 97.5 F (36.4 C) 97.7 F (36.5 C)  TempSrc: Oral  Oral Oral  SpO2: 98%  96% 94%  Weight:      Height:        Intake/Output Summary (Last 24 hours) at 05/09/2019 1033 Last data filed at 05/09/2019 0900 Gross per 24 hour  Intake 620 ml  Output 700 ml  Net -80 ml   Filed Weights   05/06/19 1138  Weight: 68.8 kg    Examination:  General exam: Appears calm and comfortable  Respiratory system: Clear to auscultation. Respiratory effort normal. No respiratory distress. No conversational dyspnea.  Cardiovascular system: S1 & S2 heard, irreg irreg, nml rate. +SEM. No pedal edema. Gastrointestinal system: Abdomen is  soft, moderately distended, tender to palpation left lower quadrant Central nervous system: Alert and awake Extremities: Symmetric in appearance  Skin: No rashes, lesions or ulcers on exposed skin  Psychiatry: Limited judgment and insight. Mood & affect appropriate.   Data Reviewed: I have personally reviewed following labs and imaging studies  CBC: Recent Labs  Lab 05/06/19 0736 04/26/2019 0137 05/08/19 0139 05/09/19 0237  WBC 10.6* 11.6* 8.4  13.4*  HGB 9.6* 9.3* 8.9* 8.8*  HCT 30.4* 30.3* 29.3* 28.2*  MCV 89.9 89.9 90.2 89.8  PLT 172 164 187 703   Basic Metabolic Panel: Recent Labs  Lab 05/06/19 0736 04/24/2019 0137 05/08/19 0139 05/09/19 0237  NA 141 140 139 141  K 4.5 4.5 5.0 4.9  CL 105 105 105 105  CO2 29 26 22 26   GLUCOSE 96 91 190* 134*  BUN 22 23 32* 45*  CREATININE 1.17* 1.15* 1.32* 1.68*  CALCIUM 8.8* 8.9 8.6* 8.5*   CBG: Recent Labs  Lab 05/08/19 0628 05/08/19 1133 05/08/19 1619 05/08/19 2034 05/09/19 0635  GLUCAP 181* 164* 146* 151* 99    Recent Results (from the past 240 hour(s))  SARS CORONAVIRUS 2 (TAT 6-24 HRS) Nasopharyngeal     Status: None   Collection Time: 04/26/2019  5:12 PM   Specimen: Nasopharyngeal  Result Value Ref Range Status   SARS Coronavirus 2 NEGATIVE NEGATIVE Final    Comment: (NOTE) SARS-CoV-2 target nucleic acids are NOT DETECTED. The SARS-CoV-2 RNA is generally detectable in upper and lower respiratory specimens during the acute phase of infection. Negative results do not preclude SARS-CoV-2 infection, do not rule out co-infections with other pathogens, and should not be used as the sole basis for treatment or other patient management decisions. Negative results must be combined with clinical observations, patient history, and epidemiological information. The expected result is Negative. Fact Sheet for Patients: SugarRoll.be Fact Sheet for Healthcare Providers: https://www.woods-mathews.com/ This test is not yet approved or cleared by the [place] FDA and  has been authorized for detection and/or diagnosis of SARS-CoV-2 by FDA under an Emergency Use Authorization (EUA). This EUA will remain  in effect (meaning this test can be used) for the duration of the COVID-19 declaration under Section 56 4(b)(1) of the Act, 21 U.S.C. section 360bbb-3(b)(1), unless the authorization is terminated or revoked sooner. Performed at  [facility], [place] [address] [place], [place] [address]   MRSA PCR Screening     Status: None   Collection Time: 04/25/2019  6:32 PM   Specimen: Nasopharyngeal  Result Value Ref Range Status   MRSA by PCR NEGATIVE NEGATIVE Final    Comment:        The GeneXpert MRSA Assay (FDA approved for NASAL specimens only), is one component of a comprehensive MRSA colonization surveillance program. It is not intended to diagnose MRSA infection nor to guide or monitor treatment for MRSA infections. Performed at [facility], [place] [address] [place], [place] [address]       Radiology Studies: DG Abd 1 View  Result Date: 05/08/2019 CLINICAL DATA:  Abdominal pain and constipation. Rule out ileus. EXAM: ABDOMEN - 1 VIEW COMPARISON:  Pelvic radiograph 04/30/2019. CT abdomen and pelvis 10/12/2017. FINDINGS: Gas is present in multiple nondilated loops of small and large bowel without evidence of obstruction. Atherosclerotic vascular calcifications and pelvic phleboliths are noted. Sequelae of proximal left femoral ORIF are partially visualized. The bones are diffusely osteopenic with chronic compression fractures in the lumbar spine and at T12,  not evaluated in detail. IMPRESSION: Nonobstructed bowel gas pattern. Electronically Signed   By: Sebastian Ache M.D.   On: 05/08/2019 12:47   Pelvis Portable  Result Date: 04/13/2019 CLINICAL DATA:  Left hip ORIF EXAM: PORTABLE PELVIS 1-2 VIEWS COMPARISON:  05/04/2019 FINDINGS: Interval placement of ORIF hardware within the proximal left femur via a antegrade intramedullary nail with distal interlocking screw and proximal lag screw. Alignment at the femoral neck is improved. Lesser trochanteric fragment remains medially displaced. Hip joint alignment is maintained without dislocation. Bones are demineralized. Expected postoperative changes within the overlying soft tissues. Extensive vascular calcifications. IMPRESSION: Interval ORIF of the proximal left  femur as above. No evidence of immediate postoperative complication. Improved alignment at the fracture site. Electronically Signed   By: Duanne Guess D.O.   On: 04/13/2019 15:24   DG C-Arm 1-60 Min  Result Date: 04/26/2019 CLINICAL DATA:  LEFT femoral trochanteric nail EXAM: DG C-ARM 1-60 MIN; OPERATIVE LEFT HIP WITH PELVIS FLUOROSCOPY TIME:  Fluoroscopy Time:  0 minutes 41 seconds Radiation Exposure Index (if provided by the fluoroscopic device): Not provided Number of Acquired Spot Images: 4 COMPARISON:  05/04/2019 FINDINGS: Images demonstrate placement of an IM nail with a compression screw at the proximal LEFT femoral intertrochanteric fracture. Improved alignment of fracture fragments since previous exam. Distal locking screw present. No dislocation or bone destruction identified. Diffuse osseous demineralization. IMPRESSION: Post ORIF proximal LEFT femur. Electronically Signed   By: Ulyses Southward M.D.   On: 05/04/2019 14:04   DG HIP OPERATIVE UNILAT WITH PELVIS LEFT  Result Date: 04/23/2019 CLINICAL DATA:  LEFT femoral trochanteric nail EXAM: DG C-ARM 1-60 MIN; OPERATIVE LEFT HIP WITH PELVIS FLUOROSCOPY TIME:  Fluoroscopy Time:  0 minutes 41 seconds Radiation Exposure Index (if provided by the fluoroscopic device): Not provided Number of Acquired Spot Images: 4 COMPARISON:  05/04/2019 FINDINGS: Images demonstrate placement of an IM nail with a compression screw at the proximal LEFT femoral intertrochanteric fracture. Improved alignment of fracture fragments since previous exam. Distal locking screw present. No dislocation or bone destruction identified. Diffuse osseous demineralization. IMPRESSION: Post ORIF proximal LEFT femur. Electronically Signed   By: Ulyses Southward M.D.   On: 04/27/2019 14:04      Scheduled Meds: . aspirin EC  325 mg Oral Q breakfast  . docusate sodium  100 mg Oral BID  . docusate sodium  100 mg Oral BID  . feeding supplement (ENSURE ENLIVE)  237 mL Oral BID BM  .  insulin aspart  0-5 Units Subcutaneous QHS  . insulin aspart  0-9 Units Subcutaneous TID WC  . insulin glargine  15 Units Subcutaneous QHS  . metoprolol tartrate  50 mg Oral BID  . multivitamin with minerals  1 tablet Oral Daily  . pantoprazole  40 mg Oral Daily   Continuous Infusions: . lactated ringers 100 mL/hr at 05/09/19 0759  . methocarbamol (ROBAXIN) IV       LOS: 4 days   Time spent: 15 minutes   Sharlene Dory, DO [facility] 05/09/2019, 10:33 AM   Available via Epic secure chat 7am-7pm After these hours, please refer to coverage provider listed on [email or web address]

## 2019-05-09 NOTE — Progress Notes (Signed)
Subjective: RN states patient more confused.  Per patient left hip ok.  still has abd pain.  Did not eat lunch.   " I don't like the food".     Objective: Vital signs in last 24 hours: Temp:  [97.5 F (36.4 C)-97.8 F (36.6 C)] 97.7 F (36.5 C) (01/29 0805) Pulse Rate:  [61-109] 61 (01/29 0805) Resp:  [16-17] 16 (01/29 0805) BP: (106-138)/(64-82) 106/81 (01/29 0805) SpO2:  [94 %-98 %] 94 % (01/29 0805)  Intake/Output from previous day: 01/28 0701 - 01/29 0700 In: 500 [I.V.:500] Out: 300 [Urine:300] Intake/Output this shift: Total I/O In: 120 [P.O.:120] Out: 400 [Urine:400]  Recent Labs    05/09/2019 0137 05/08/19 0139 05/09/19 0237  HGB 9.3* 8.9* 8.8*   Recent Labs    05/08/19 0139 05/09/19 0237  WBC 8.4 13.4*  RBC 3.25* 3.14*  HCT 29.3* 28.2*  PLT 187 211   Recent Labs    05/08/19 0139 05/09/19 0237  NA 139 141  K 5.0 4.9  CL 105 105  CO2 22 26  BUN 32* 45*  CREATININE 1.32* 1.68*  GLUCOSE 190* 134*  CALCIUM 8.6* 8.5*   No results for input(s): LABPT, INR in the last 72 hours.  Exam: Pleasant.  Wounds look good.  Staples intact.  No drainage or signs of infection.      Assessment/Plan: D/c morphine and norco.  Ordered ultram prn for pain.  Give dulcolax supp today. Stable from ortho standpoint.  D/c planning per medicine service.      Zonia Kief 05/09/2019, 1:07 PM

## 2019-05-10 LAB — URINALYSIS, ROUTINE W REFLEX MICROSCOPIC
Bilirubin Urine: NEGATIVE
Glucose, UA: NEGATIVE mg/dL
Hgb urine dipstick: NEGATIVE
Ketones, ur: NEGATIVE mg/dL
Nitrite: NEGATIVE
Protein, ur: NEGATIVE mg/dL
Specific Gravity, Urine: 1.016 (ref 1.005–1.030)
pH: 5 (ref 5.0–8.0)

## 2019-05-10 LAB — CBC
HCT: 28.2 % — ABNORMAL LOW (ref 36.0–46.0)
Hemoglobin: 8.8 g/dL — ABNORMAL LOW (ref 12.0–15.0)
MCH: 28.1 pg (ref 26.0–34.0)
MCHC: 31.2 g/dL (ref 30.0–36.0)
MCV: 90.1 fL (ref 80.0–100.0)
Platelets: 210 10*3/uL (ref 150–400)
RBC: 3.13 MIL/uL — ABNORMAL LOW (ref 3.87–5.11)
RDW: 14.7 % (ref 11.5–15.5)
WBC: 12.5 10*3/uL — ABNORMAL HIGH (ref 4.0–10.5)
nRBC: 0.6 % — ABNORMAL HIGH (ref 0.0–0.2)

## 2019-05-10 LAB — BASIC METABOLIC PANEL
Anion gap: 12 (ref 5–15)
BUN: 45 mg/dL — ABNORMAL HIGH (ref 8–23)
CO2: 25 mmol/L (ref 22–32)
Calcium: 8.5 mg/dL — ABNORMAL LOW (ref 8.9–10.3)
Chloride: 102 mmol/L (ref 98–111)
Creatinine, Ser: 1.59 mg/dL — ABNORMAL HIGH (ref 0.44–1.00)
GFR calc Af Amer: 33 mL/min — ABNORMAL LOW (ref >60)
GFR calc non Af Amer: 28 mL/min — ABNORMAL LOW (ref >60)
Glucose, Bld: 93 mg/dL (ref 70–99)
Potassium: 4.7 mmol/L (ref 3.5–5.1)
Sodium: 139 mmol/L (ref 135–145)

## 2019-05-10 LAB — GLUCOSE, CAPILLARY
Glucose-Capillary: 90 mg/dL (ref 70–99)
Glucose-Capillary: 95 mg/dL (ref 70–99)
Glucose-Capillary: 79 mg/dL (ref 70–99)
Glucose-Capillary: 107 mg/dL — ABNORMAL HIGH (ref 70–99)

## 2019-05-10 MED ORDER — INSULIN GLARGINE 100 UNIT/ML ~~LOC~~ SOLN
12.0000 [IU] | Freq: Every day | SUBCUTANEOUS | Status: DC
  Administered 2019-05-11 – 2019-05-12 (×2): 12 [IU] via SUBCUTANEOUS
  Filled 2019-05-10 (×5): qty 0.12

## 2019-05-10 MED ORDER — ACETAMINOPHEN 500 MG PO TABS
500.0000 mg | ORAL_TABLET | Freq: Four times a day (QID) | ORAL | Status: DC | PRN
  Administered 2019-05-13: 500 mg via ORAL
  Filled 2019-05-10: qty 1

## 2019-05-10 MED ORDER — ACETAMINOPHEN 500 MG PO TABS
500.0000 mg | ORAL_TABLET | Freq: Three times a day (TID) | ORAL | Status: DC
  Administered 2019-05-10 – 2019-05-13 (×11): 500 mg via ORAL
  Filled 2019-05-10 (×11): qty 1

## 2019-05-10 MED ORDER — BISACODYL 5 MG PO TBEC
5.0000 mg | DELAYED_RELEASE_TABLET | Freq: Once | ORAL | Status: AC
  Administered 2019-05-10: 5 mg via ORAL
  Filled 2019-05-10: qty 1

## 2019-05-10 MED ORDER — NYSTATIN 100000 UNIT/ML MT SUSP
5.0000 mL | Freq: Four times a day (QID) | OROMUCOSAL | Status: DC
  Administered 2019-05-10 – 2019-05-13 (×12): 500000 [IU] via ORAL
  Filled 2019-05-10 (×18): qty 5

## 2019-05-10 NOTE — Progress Notes (Signed)
PROGRESS NOTE    Rhonda Hamilton  VVO:[id number] DOB: 08/05/1927 DOA: 04/14/2019 PCP: Patient, No Pcp Per   Brief Narrative: 86 year old resident at [facility] who had a fall that was followed by severe left hip pain and inability to walk.  Patient with past medical history significant for dementia, diabetes, hypertension, osteoporosis and COPD.  She was normally ambulatory with a walker.  She was found she had a left intertrochanteric hip fracture.  Wrist x-ray show an ulnar styloid fracture.  She was transferred to [facility] for surgery. Patient had surgery in the left hip on 1/27.  PT recommended skilled nursing facility. Hospital course  complicated by poor oral intake delirium and constipation.  Assessment & Plan:   Principal Problem:   Closed intertrochanteric fracture of hip, left, initial encounter [facility]) Active Problems:   Traumatic closed fracture of ulnar styloid with minimal displacement, left, initial encounter   Hypertension   Diabetes mellitus (HCC)   Dementia (HCC)   COPD (chronic obstructive pulmonary disease) (HCC)   CKD (chronic kidney disease), stage III   Atrial fibrillation, chronic (HCC)  1-Closed intertrochanteric fracture of the left Patient underwent left trochanteric nail fixation and comminuted left intertrochanteric hip fracture on 1/27 by Dr. Ophelia Charter.  PT recommend SNF Pain management, narcotic has been discontinued because patient was more confuse.  Will add scheduled Tylenol  Leukocytosis. Trending down. Afebrile. Repeat in am.  check UA  AKI: Chronic kidney disease a stage III, prior cr 1.1. Due to poor oral intake. Continue with IV fluids. Cr trending down, peak to 1.6.  Decrease to 1.5.   Traumatic closed fracture of the ulnar styloid with minimal displacement of the left: Splinting Ortho following  Constipation: Per nurse report patient had bowel movement last night. We will continue with laxative.  Hypertension: Continue with  metoprolol twice daily  Diabetes: Continue with a sliding scale  Dementia, acute delirium She was notice to be more confuse yesterday.  Morphine and m=norco discontinue  She is alert, and pleasant today.   A. fib.  Stable   Nutrition Problem: Increased nutrient needs Etiology: post-op healing    Signs/Symptoms: estimated needs    Interventions: Ensure Enlive (each supplement provides 350kcal and 20 grams of protein), MVI, Magic cup  Estimated body mass index is 26.04 kg/m as calculated from the following:   Height as of this encounter: 5\' 4"  (1.626 m).   Weight as of this encounter: 68.8 kg.   DVT prophylaxis: Aspirin Code Status: DNR Family Communication: son updated Disposition Plan:  Patient from [facility] living facility Patient to be discharged to skilled nursing facility Barriers to discharge: include  poor oral intake, bed availability Consultants:   Ortho  Procedures:   ORIF  left hip 20/27/21  Antimicrobials:    Subjective: Alert pleasantly confused. Complaining of hip pain  Objective: Vitals:   05/09/19 1540 05/09/19 2039 05/10/19 0355 05/10/19 0747  BP: 122/79 (!) 101/57 (!) 110/58 124/75  Pulse: 92 100 88 (!) 107  Resp:      Temp:  97.7 F (36.5 C) 98.6 F (37 C) 98.4 F (36.9 C)  TempSrc:  Oral Oral Oral  SpO2:  96% 94% 92%  Weight:      Height:        Intake/Output Summary (Last 24 hours) at 05/10/2019 0934 Last data filed at 05/09/2019 1859 Gross per 24 hour  Intake 240 ml  Output 300 ml  Net -60 ml   Filed Weights   05/06/19 1138  Weight: 68.8 kg  Examination:  General exam: Appears calm and comfortable  Respiratory system: Clear to auscultation. Respiratory effort normal. Cardiovascular system: S1 & S2 heard, RRR Gastrointestinal system: Abdomen is nondistended, soft and nontender.  Central nervous system: Alert and oriented. No focal neurological deficits. Extremities: Trace edema, dressing left hip, splinter in  left hand.   Data Reviewed: I have personally reviewed following labs and imaging studies  CBC: Recent Labs  Lab 05/06/19 0736 05/11/2019 0137 05/08/19 0139 05/09/19 0237 05/10/19 0431  WBC 10.6* 11.6* 8.4 13.4* 12.5*  HGB 9.6* 9.3* 8.9* 8.8* 8.8*  HCT 30.4* 30.3* 29.3* 28.2* 28.2*  MCV 89.9 89.9 90.2 89.8 90.1  PLT 172 164 187 211 196   Basic Metabolic Panel: Recent Labs  Lab 05/06/19 0736 04/12/2019 0137 05/08/19 0139 05/09/19 0237 05/10/19 0431  NA 141 140 139 141 139  K 4.5 4.5 5.0 4.9 4.7  CL 105 105 105 105 102  CO2 29 26 22 26 25   GLUCOSE 96 91 190* 134* 93  BUN 22 23 32* 45* 45*  CREATININE 1.17* 1.15* 1.32* 1.68* 1.59*  CALCIUM 8.8* 8.9 8.6* 8.5* 8.5*   GFR: Estimated Creatinine Clearance: 22.8 mL/min (A) (by C-G formula based on SCr of 1.59 mg/dL (H)). Liver Function Tests: No results for input(s): AST, ALT, ALKPHOS, BILITOT, PROT, ALBUMIN in the last 168 hours. No results for input(s): LIPASE, AMYLASE in the last 168 hours. No results for input(s): AMMONIA in the last 168 hours. Coagulation Profile: No results for input(s): INR, PROTIME in the last 168 hours. Cardiac Enzymes: No results for input(s): CKTOTAL, CKMB, CKMBINDEX, TROPONINI in the last 168 hours. BNP (last 3 results) No results for input(s): PROBNP in the last 8760 hours. HbA1C: No results for input(s): HGBA1C in the last 72 hours. CBG: Recent Labs  Lab 05/09/19 0635 05/09/19 1120 05/09/19 1601 05/09/19 2307 05/10/19 0632  GLUCAP 99 133* 170* 148* 90   Lipid Profile: No results for input(s): CHOL, HDL, LDLCALC, TRIG, CHOLHDL, LDLDIRECT in the last 72 hours. Thyroid Function Tests: No results for input(s): TSH, T4TOTAL, FREET4, T3FREE, THYROIDAB in the last 72 hours. Anemia Panel: No results for input(s): VITAMINB12, FOLATE, FERRITIN, TIBC, IRON, RETICCTPCT in the last 72 hours. Sepsis Labs: No results for input(s): PROCALCITON, LATICACIDVEN in the last 168 hours.  Recent Results  (from the past 240 hour(s))  SARS CORONAVIRUS 2 (TAT 6-24 HRS) Nasopharyngeal     Status: None   Collection Time: 05/10/2019  5:12 PM   Specimen: Nasopharyngeal  Result Value Ref Range Status   SARS Coronavirus 2 NEGATIVE NEGATIVE Final    Comment: (NOTE) SARS-CoV-2 target nucleic acids are NOT DETECTED. The SARS-CoV-2 RNA is generally detectable in upper and lower respiratory specimens during the acute phase of infection. Negative results do not preclude SARS-CoV-2 infection, do not rule out co-infections with other pathogens, and should not be used as the sole basis for treatment or other patient management decisions. Negative results must be combined with clinical observations, patient history, and epidemiological information. The expected result is Negative. Fact Sheet for Patients: SugarRoll.be Fact Sheet for Healthcare Providers: https://www.woods-mathews.com/ This test is not yet approved or cleared by the [place] FDA and  has been authorized for detection and/or diagnosis of SARS-CoV-2 by FDA under an Emergency Use Authorization (EUA). This EUA will remain  in effect (meaning this test can be used) for the duration of the COVID-19 declaration under Section 56 4(b)(1) of the Act, 21 U.S.C. section 360bbb-3(b)(1), unless the authorization is terminated or  revoked sooner. Performed at [facility], [address] [place], [place] [address]   MRSA PCR Screening     Status: None   Collection Time: 04/20/2019  6:32 PM   Specimen: Nasopharyngeal  Result Value Ref Range Status   MRSA by PCR NEGATIVE NEGATIVE Final    Comment:        The GeneXpert MRSA Assay (FDA approved for NASAL specimens only), is one component of a comprehensive MRSA colonization surveillance program. It is not intended to diagnose MRSA infection nor to guide or monitor treatment for MRSA infections. Performed at [facility], [address], [place], [place] [address]          Radiology Studies: DG Abd 1 View  Result Date: 05/08/2019 CLINICAL DATA:  Abdominal pain and constipation. Rule out ileus. EXAM: ABDOMEN - 1 VIEW COMPARISON:  Pelvic radiograph 04/15/2019. CT abdomen and pelvis 10/12/2017. FINDINGS: Gas is present in multiple nondilated loops of small and large bowel without evidence of obstruction. Atherosclerotic vascular calcifications and pelvic phleboliths are noted. Sequelae of proximal left femoral ORIF are partially visualized. The bones are diffusely osteopenic with chronic compression fractures in the lumbar spine and at T12, not evaluated in detail. IMPRESSION: Nonobstructed bowel gas pattern. Electronically Signed   By: Sebastian Ache M.D.   On: 05/08/2019 12:47        Scheduled Meds: . acetaminophen  500 mg Oral TID  . aspirin EC  325 mg Oral Q breakfast  . bisacodyl  5 mg Oral Once  . docusate sodium  100 mg Oral BID  . feeding supplement (ENSURE ENLIVE)  237 mL Oral BID BM  . insulin aspart  0-5 Units Subcutaneous QHS  . insulin aspart  0-9 Units Subcutaneous TID WC  . insulin glargine  12 Units Subcutaneous QHS  . metoprolol tartrate  50 mg Oral BID  . multivitamin with minerals  1 tablet Oral Daily  . pantoprazole  40 mg Oral Daily   Continuous Infusions: . lactated ringers 100 mL/hr at 05/09/19 2003  . methocarbamol (ROBAXIN) IV       LOS: 5 days    Time spent: 35 minutes    Alba Cory, MD [facility]  If 7PM-7AM, please contact night-coverage  05/10/2019, 9:34 AM

## 2019-05-10 NOTE — Plan of Care (Signed)
?  Problem: Nutrition: ?Goal: Adequate nutrition will be maintained ?Outcome: Progressing ?  ?Problem: Elimination: ?Goal: Will not experience complications related to bowel motility ?Outcome: Progressing ?Goal: Will not experience complications related to urinary retention ?Outcome: Progressing ?  ?Problem: Pain Managment: ?Goal: General experience of comfort will improve ?Outcome: Progressing ?  ?  ?

## 2019-05-10 NOTE — Plan of Care (Signed)
  Problem: Pain Managment: Goal: General experience of comfort will improve Outcome: Progressing   Problem: Safety: Goal: Ability to remain free from injury will improve Outcome: Progressing   Problem: Skin Integrity: Goal: Risk for impaired skin integrity will decrease Outcome: Progressing   

## 2019-05-11 LAB — TYPE AND SCREEN
ABO/RH(D): A POS
Antibody Screen: NEGATIVE
Unit division: 0
Unit division: 0

## 2019-05-11 LAB — BASIC METABOLIC PANEL
Anion gap: 9 (ref 5–15)
BUN: 44 mg/dL — ABNORMAL HIGH (ref 8–23)
CO2: 25 mmol/L (ref 22–32)
Calcium: 8.4 mg/dL — ABNORMAL LOW (ref 8.9–10.3)
Chloride: 105 mmol/L (ref 98–111)
Creatinine, Ser: 1.74 mg/dL — ABNORMAL HIGH (ref 0.44–1.00)
GFR calc Af Amer: 30 mL/min — ABNORMAL LOW (ref >60)
GFR calc non Af Amer: 26 mL/min — ABNORMAL LOW (ref >60)
Glucose, Bld: 96 mg/dL (ref 70–99)
Potassium: 4.6 mmol/L (ref 3.5–5.1)
Sodium: 139 mmol/L (ref 135–145)

## 2019-05-11 LAB — GLUCOSE, CAPILLARY
Glucose-Capillary: 89 mg/dL (ref 70–99)
Glucose-Capillary: 163 mg/dL — ABNORMAL HIGH (ref 70–99)
Glucose-Capillary: 144 mg/dL — ABNORMAL HIGH (ref 70–99)
Glucose-Capillary: 121 mg/dL — ABNORMAL HIGH (ref 70–99)

## 2019-05-11 LAB — CBC
HCT: 28.1 % — ABNORMAL LOW (ref 36.0–46.0)
Hemoglobin: 8.6 g/dL — ABNORMAL LOW (ref 12.0–15.0)
MCH: 27.7 pg (ref 26.0–34.0)
MCHC: 30.6 g/dL (ref 30.0–36.0)
MCV: 90.4 fL (ref 80.0–100.0)
Platelets: 208 10*3/uL (ref 150–400)
RBC: 3.11 MIL/uL — ABNORMAL LOW (ref 3.87–5.11)
RDW: 14.8 % (ref 11.5–15.5)
WBC: 12.5 10*3/uL — ABNORMAL HIGH (ref 4.0–10.5)
nRBC: 0.6 % — ABNORMAL HIGH (ref 0.0–0.2)

## 2019-05-11 LAB — BPAM RBC
Blood Product Expiration Date: 202102212359
Blood Product Expiration Date: 202102212359
Unit Type and Rh: 6200
Unit Type and Rh: 6200

## 2019-05-11 MED ORDER — SODIUM CHLORIDE 0.9 % IV BOLUS
250.0000 mL | Freq: Once | INTRAVENOUS | Status: AC
  Administered 2019-05-11: 250 mL via INTRAVENOUS

## 2019-05-11 NOTE — Progress Notes (Signed)
PROGRESS NOTE    Jaleah Lefevre  TKW:[id number] DOB: [birth year] DOA: 04/24/2019 PCP: Patient, No Pcp Per   Brief Narrative: 86 year old resident at [facility] who had a fall that was followed by severe left hip pain and inability to walk.  Patient with past medical history significant for dementia, diabetes, hypertension, osteoporosis and COPD.  She was normally ambulatory with a walker.  She was found she had a left intertrochanteric hip fracture.  Wrist x-ray show an ulnar styloid fracture.  She was transferred to [facility] for surgery. Patient had surgery in the left hip on 1/27.  PT recommended skilled nursing facility. Hospital course  complicated by poor oral intake delirium and constipation.  Assessment & Plan:   Principal Problem:   Closed intertrochanteric fracture of hip, left, initial encounter [facility]) Active Problems:   Traumatic closed fracture of ulnar styloid with minimal displacement, left, initial encounter   Hypertension   Diabetes mellitus (HCC)   Dementia (HCC)   COPD (chronic obstructive pulmonary disease) (HCC)   CKD (chronic kidney disease), stage III   Atrial fibrillation, chronic (HCC)  1-Closed intertrochanteric fracture of the left Patient underwent left trochanteric nail fixation and comminuted left intertrochanteric hip fracture on 1/27 by Dr. Ophelia Charter.  PT recommend SNF Pain management, narcotic has been discontinued because patient was more confuse.  Will add scheduled Tylenol  Leukocytosis. Trending down. Afebrile. WBC stable.  UA with not significant WBC>    AKI: Chronic kidney disease a stage III, prior cr 1.1. Due to poor oral intake. Continue with IV fluids. Cr trending down, peak to 1.6.  Cr increase to 1.7. IV bolus. Bladder scan.   Traumatic closed fracture of the ulnar styloid with minimal displacement of the left: Splinting Ortho following  Constipation: Per nurse report patient had bowel movement last night. We will continue with  laxative. Had BM  Hypertension: Continue with metoprolol twice daily  Diabetes: Continue with a sliding scale  Dementia, acute delirium She was notice to be more confuse yesterday.  Morphine and m=norco discontinue  She is alert, and pleasant today.   A. fib.  Stable   Nutrition Problem: Increased nutrient needs Etiology: post-op healing    Signs/Symptoms: estimated needs    Interventions: Ensure Enlive (each supplement provides 350kcal and 20 grams of protein), MVI, Magic cup  Estimated body mass index is 26.04 kg/m as calculated from the following:   Height as of this encounter: 5\' 4"  (1.626 m).   Weight as of this encounter: 68.8 kg.   DVT prophylaxis: Aspirin Code Status: DNR Family Communication: son updated Disposition Plan:  Patient from [place] living facility Patient to be discharged to skilled nursing facility Barriers to discharge: include  poor oral intake, bed availability Consultants:   Ortho  Procedures:   ORIF  left hip 20/27/21  Antimicrobials:    Subjective: Confuse, paranoid  Objective: Vitals:   05/10/19 1642 05/10/19 2005 05/10/19 2200 05/11/19 0648  BP: (!) 122/57 105/64 116/64 115/71  Pulse: 86 86 87 89  Resp:      Temp: 98.4 F (36.9 C) 97.7 F (36.5 C)  98.5 F (36.9 C)  TempSrc: Oral Oral  Oral  SpO2: 97% 96%  92%  Weight:      Height:        Intake/Output Summary (Last 24 hours) at 05/11/2019 0708 Last data filed at 05/10/2019 2154 Gross per 24 hour  Intake 1214 ml  Output 750 ml  Net 464 ml   Filed Weights   05/06/19 1138  Weight: 68.8 kg    Examination:  General exam: NAD Respiratory system: CTA Cardiovascular system: S 1, S 2 RRR Gastrointestinal system: BS present, NT Central nervous system: alert, confuse Extremities: Trace edema, dressing left hip, splinter in left hand.   Data Reviewed: I have personally reviewed following labs and imaging studies  CBC: Recent Labs  Lab 05/06/19 0736  04/27/2019 0137 05/08/19 0139 05/09/19 0237 05/10/19 0431  WBC 10.6* 11.6* 8.4 13.4* 12.5*  HGB 9.6* 9.3* 8.9* 8.8* 8.8*  HCT 30.4* 30.3* 29.3* 28.2* 28.2*  MCV 89.9 89.9 90.2 89.8 90.1  PLT 172 164 187 211 210   Basic Metabolic Panel: Recent Labs  Lab 05/06/19 0736 04/23/2019 0137 05/08/19 0139 05/09/19 0237 05/10/19 0431  NA 141 140 139 141 139  K 4.5 4.5 5.0 4.9 4.7  CL 105 105 105 105 102  CO2 29 26 22 26 25   GLUCOSE 96 91 190* 134* 93  BUN 22 23 32* 45* 45*  CREATININE 1.17* 1.15* 1.32* 1.68* 1.59*  CALCIUM 8.8* 8.9 8.6* 8.5* 8.5*   GFR: Estimated Creatinine Clearance: 22.8 mL/min (A) (by C-G formula based on SCr of 1.59 mg/dL (H)). Liver Function Tests: No results for input(s): AST, ALT, ALKPHOS, BILITOT, PROT, ALBUMIN in the last 168 hours. No results for input(s): LIPASE, AMYLASE in the last 168 hours. No results for input(s): AMMONIA in the last 168 hours. Coagulation Profile: No results for input(s): INR, PROTIME in the last 168 hours. Cardiac Enzymes: No results for input(s): CKTOTAL, CKMB, CKMBINDEX, TROPONINI in the last 168 hours. BNP (last 3 results) No results for input(s): PROBNP in the last 8760 hours. HbA1C: No results for input(s): HGBA1C in the last 72 hours. CBG: Recent Labs  Lab 05/10/19 0632 05/10/19 1236 05/10/19 1642 05/10/19 2154 05/11/19 0651  GLUCAP 90 95 79 107* 89   Lipid Profile: No results for input(s): CHOL, HDL, LDLCALC, TRIG, CHOLHDL, LDLDIRECT in the last 72 hours. Thyroid Function Tests: No results for input(s): TSH, T4TOTAL, FREET4, T3FREE, THYROIDAB in the last 72 hours. Anemia Panel: No results for input(s): VITAMINB12, FOLATE, FERRITIN, TIBC, IRON, RETICCTPCT in the last 72 hours. Sepsis Labs: No results for input(s): PROCALCITON, LATICACIDVEN in the last 168 hours.  Recent Results (from the past 240 hour(s))  SARS CORONAVIRUS 2 (TAT 6-24 HRS) Nasopharyngeal     Status: None   Collection Time: 23-May-2019  5:12 PM    Specimen: Nasopharyngeal  Result Value Ref Range Status   SARS Coronavirus 2 NEGATIVE NEGATIVE Final    Comment: (NOTE) SARS-CoV-2 target nucleic acids are NOT DETECTED. The SARS-CoV-2 RNA is generally detectable in upper and lower respiratory specimens during the acute phase of infection. Negative results do not preclude SARS-CoV-2 infection, do not rule out co-infections with other pathogens, and should not be used as the sole basis for treatment or other patient management decisions. Negative results must be combined with clinical observations, patient history, and epidemiological information. The expected result is Negative. Fact Sheet for Patients: 04/16/2019 Fact Sheet for Healthcare Providers: HairSlick.no This test is not yet approved or cleared by the [place] FDA and  has been authorized for detection and/or diagnosis of SARS-CoV-2 by FDA under an Emergency Use Authorization (EUA). This EUA will remain  in effect (meaning this test can be used) for the duration of the COVID-19 declaration under Section 56 4(b)(1) of the Act, 21 U.S.C. section 360bbb-3(b)(1), unless the authorization is terminated or revoked sooner. Performed at [facility], [address]  [place], [place] [address]   MRSA PCR Screening     Status: None   Collection Time: 05/03/2019  6:32 PM   Specimen: Nasopharyngeal  Result Value Ref Range Status   MRSA by PCR NEGATIVE NEGATIVE Final    Comment:        The GeneXpert MRSA Assay (FDA approved for NASAL specimens only), is one component of a comprehensive MRSA colonization surveillance program. It is not intended to diagnose MRSA infection nor to guide or monitor treatment for MRSA infections. Performed at [facility], [address], [place], [place] [address]          Radiology Studies: No results found.      Scheduled Meds: . acetaminophen  500 mg Oral  TID  . aspirin EC  325 mg Oral Q breakfast  . docusate sodium  100 mg Oral BID  . feeding supplement (ENSURE ENLIVE)  237 mL Oral BID BM  . insulin aspart  0-5 Units Subcutaneous QHS  . insulin aspart  0-9 Units Subcutaneous TID WC  . insulin glargine  12 Units Subcutaneous QHS  . metoprolol tartrate  50 mg Oral BID  . multivitamin with minerals  1 tablet Oral Daily  . nystatin  5 mL Oral QID  . pantoprazole  40 mg Oral Daily   Continuous Infusions: . lactated ringers 100 mL/hr at 05/10/19 1643  . methocarbamol (ROBAXIN) IV       LOS: 6 days    Time spent: 35 minutes    Elmarie Shiley, MD [facility]  If 7PM-7AM, please contact night-coverage  05/11/2019, 7:08 AM

## 2019-05-12 ENCOUNTER — Inpatient Hospital Stay (HOSPITAL_COMMUNITY): Payer: Medicare Other

## 2019-05-12 LAB — CBC
HCT: 30 % — ABNORMAL LOW (ref 36.0–46.0)
Hemoglobin: 9.2 g/dL — ABNORMAL LOW (ref 12.0–15.0)
MCH: 28.3 pg (ref 26.0–34.0)
MCHC: 30.7 g/dL (ref 30.0–36.0)
MCV: 92.3 fL (ref 80.0–100.0)
Platelets: 248 10*3/uL (ref 150–400)
RBC: 3.25 MIL/uL — ABNORMAL LOW (ref 3.87–5.11)
RDW: 15.5 % (ref 11.5–15.5)
WBC: 13.5 10*3/uL — ABNORMAL HIGH (ref 4.0–10.5)
nRBC: 0.6 % — ABNORMAL HIGH (ref 0.0–0.2)

## 2019-05-12 LAB — BASIC METABOLIC PANEL
Anion gap: 9 (ref 5–15)
BUN: 40 mg/dL — ABNORMAL HIGH (ref 8–23)
CO2: 27 mmol/L (ref 22–32)
Calcium: 8.5 mg/dL — ABNORMAL LOW (ref 8.9–10.3)
Chloride: 104 mmol/L (ref 98–111)
Creatinine, Ser: 1.87 mg/dL — ABNORMAL HIGH (ref 0.44–1.00)
GFR calc Af Amer: 27 mL/min — ABNORMAL LOW (ref >60)
GFR calc non Af Amer: 23 mL/min — ABNORMAL LOW (ref >60)
Glucose, Bld: 112 mg/dL — ABNORMAL HIGH (ref 70–99)
Potassium: 4.9 mmol/L (ref 3.5–5.1)
Sodium: 140 mmol/L (ref 135–145)

## 2019-05-12 LAB — GLUCOSE, CAPILLARY
Glucose-Capillary: 100 mg/dL — ABNORMAL HIGH (ref 70–99)
Glucose-Capillary: 127 mg/dL — ABNORMAL HIGH (ref 70–99)
Glucose-Capillary: 163 mg/dL — ABNORMAL HIGH (ref 70–99)
Glucose-Capillary: 198 mg/dL — ABNORMAL HIGH (ref 70–99)

## 2019-05-12 MED ORDER — SODIUM CHLORIDE 0.9% FLUSH: 10.0000 mL | INTRAVENOUS | Status: DC | PRN

## 2019-05-12 MED ORDER — SODIUM CHLORIDE 0.9% FLUSH
10.0000 mL | Freq: Two times a day (BID) | INTRAVENOUS | Status: DC
  Administered 2019-05-13: 10 mL
  Administered 2019-05-13: 14 mL

## 2019-05-12 MED ORDER — SODIUM CHLORIDE 0.9 % IV BOLUS
1000.0000 mL | Freq: Once | INTRAVENOUS | Status: AC
  Administered 2019-05-12: 1000 mL via INTRAVENOUS

## 2019-05-12 MED ORDER — BISACODYL 10 MG RE SUPP: 10.0000 mg | Freq: Every day | RECTAL | Status: DC | PRN

## 2019-05-12 MED ORDER — WHITE PETROLATUM EX OINT
TOPICAL_OINTMENT | CUTANEOUS | Status: AC
  Filled 2019-05-12: qty 28.35

## 2019-05-12 MED ORDER — ENSURE ENLIVE PO LIQD
237.0000 mL | Freq: Three times a day (TID) | ORAL | Status: DC
  Administered 2019-05-12 – 2019-05-13 (×4): 237 mL via ORAL

## 2019-05-12 NOTE — Progress Notes (Addendum)
PROGRESS NOTE    Rhonda Hamilton  VHQ:[id number] DOB: July 28, 1927 DOA: June 03, 2019 PCP: Patient, No Pcp Per   Brief Narrative: 86 year old resident at [facility] who had a fall that was followed by severe left hip pain and inability to walk.  Patient with past medical history significant for dementia, diabetes, hypertension, osteoporosis and COPD.  She was normally ambulatory with a walker.  She was found she had a left intertrochanteric hip fracture.  Wrist x-ray show an ulnar styloid fracture.  She was transferred to [facility] for surgery. Patient had surgery in the left hip on 1/27.  PT recommended skilled nursing facility. Hospital course  complicated by poor oral intake delirium and constipation.  Assessment & Plan:   Principal Problem:   Closed intertrochanteric fracture of hip, left, initial encounter [facility]) Active Problems:   Traumatic closed fracture of ulnar styloid with minimal displacement, left, initial encounter   Hypertension   Diabetes mellitus (HCC)   Dementia (HCC)   COPD (chronic obstructive pulmonary disease) (HCC)   CKD (chronic kidney disease), stage III   Atrial fibrillation, chronic (HCC)  1-Closed intertrochanteric fracture of the left Patient underwent left trochanteric nail fixation and comminuted left intertrochanteric hip fracture on 1/27 by Dr. Lorin Mercy.  PT recommend SNF Pain management, narcotic has been discontinued because patient was more confuse. On scheduled Tylenol  Leukocytosis. Trending down. Afebrile. WBC stable.  UA with not significant WBC>    AKI: Chronic kidney disease a stage III, prior cr 1.1. Due to poor oral intake. Continue with IV fluids. Cr increasing. Will give IV bolus.  Continue with IV fluids.   Traumatic closed fracture of the ulnar styloid with minimal displacement of the left: Splinting Ortho following  Nutrition; poor oral intake.  Ensure order.   Constipation: Per nurse report patient had bowel movement last  night. We will continue with laxative. Had BM  Hypertension: Continue with metoprolol twice daily  Diabetes: Continue with a sliding scale  Dementia, acute delirium She was notice to be more confuse yesterday.  Morphine and m=norco discontinue  She is alert, and pleasant today.   A. fib.  Stable   Nutrition Problem: Increased nutrient needs Etiology: post-op healing    Signs/Symptoms: estimated needs    Interventions: Ensure Enlive (each supplement provides 350kcal and 20 grams of protein), MVI, Magic cup  Estimated body mass index is 26.04 kg/m as calculated from the following:   Height as of this encounter: 5\' 4"  (1.626 m).   Weight as of this encounter: 68.8 kg.   DVT prophylaxis: Aspirin Code Status: DNR Family Communication: son updated 2-01 Disposition Plan:  Patient from [place] living facility Patient to be discharged to skilled nursing facility Barriers to discharge: include  poor oral intake, worsening renal function, requiring IV fluids.   Consultants:   Ortho  Procedures:   ORIF  left hip 20/27/21  Antimicrobials:    Subjective: Less paranoid. Poor oral intake.  Alert, follows some command.    Objective: Vitals:   05/11/19 2122 05/12/19 0356 05/12/19 0914 05/12/19 0921  BP: (!) 112/98 110/68 110/68 116/73  Pulse: (!) 104 87 87 (!) 104  Resp:  16  16  Temp:  97.9 F (36.6 C)  97.8 F (36.6 C)  TempSrc:  Oral  Oral  SpO2:  98%  94%  Weight:      Height:        Intake/Output Summary (Last 24 hours) at 05/12/2019 1338 Last data filed at 05/12/2019 0900 Gross per 24 hour  Intake 730 ml  Output 800 ml  Net -70 ml   Filed Weights   05/06/19 1138  Weight: 68.8 kg    Examination:  General exam: NAD Respiratory system: CTA Cardiovascular system: S 1, S 2 RRR Gastrointestinal system: BS present, soft, nt Central nervous system: Alert.  Extremities: Trace edema, dressing left hip, splinter in left hand.   Data Reviewed: I  have personally reviewed following labs and imaging studies  CBC: Recent Labs  Lab 05/08/19 0139 05/09/19 0237 05/10/19 0431 05/11/19 0652 05/12/19 0836  WBC 8.4 13.4* 12.5* 12.5* 13.5*  HGB 8.9* 8.8* 8.8* 8.6* 9.2*  HCT 29.3* 28.2* 28.2* 28.1* 30.0*  MCV 90.2 89.8 90.1 90.4 92.3  PLT 187 211 210 208 248   Basic Metabolic Panel: Recent Labs  Lab 05/08/19 0139 05/09/19 0237 05/10/19 0431 05/11/19 0652 05/12/19 0836  NA 139 141 139 139 140  K 5.0 4.9 4.7 4.6 4.9  CL 105 105 102 105 104  CO2 22 26 25 25 27   GLUCOSE 190* 134* 93 96 112*  BUN 32* 45* 45* 44* 40*  CREATININE 1.32* 1.68* 1.59* 1.74* 1.87*  CALCIUM 8.6* 8.5* 8.5* 8.4* 8.5*   GFR: Estimated Creatinine Clearance: 19.4 mL/min (A) (by C-G formula based on SCr of 1.87 mg/dL (H)). Liver Function Tests: No results for input(s): AST, ALT, ALKPHOS, BILITOT, PROT, ALBUMIN in the last 168 hours. No results for input(s): LIPASE, AMYLASE in the last 168 hours. No results for input(s): AMMONIA in the last 168 hours. Coagulation Profile: No results for input(s): INR, PROTIME in the last 168 hours. Cardiac Enzymes: No results for input(s): CKTOTAL, CKMB, CKMBINDEX, TROPONINI in the last 168 hours. BNP (last 3 results) No results for input(s): PROBNP in the last 8760 hours. HbA1C: No results for input(s): HGBA1C in the last 72 hours. CBG: Recent Labs  Lab 05/11/19 1323 05/11/19 1625 05/11/19 2147 05/12/19 0700 05/12/19 1223  GLUCAP 163* 144* 121* 100* 127*   Lipid Profile: No results for input(s): CHOL, HDL, LDLCALC, TRIG, CHOLHDL, LDLDIRECT in the last 72 hours. Thyroid Function Tests: No results for input(s): TSH, T4TOTAL, FREET4, T3FREE, THYROIDAB in the last 72 hours. Anemia Panel: No results for input(s): VITAMINB12, FOLATE, FERRITIN, TIBC, IRON, RETICCTPCT in the last 72 hours. Sepsis Labs: No results for input(s): PROCALCITON, LATICACIDVEN in the last 168 hours.  Recent Results (from the past 240  hour(s))  SARS CORONAVIRUS 2 (TAT 6-24 HRS) Nasopharyngeal     Status: None   Collection Time: 2019-05-17  5:12 PM   Specimen: Nasopharyngeal  Result Value Ref Range Status   SARS Coronavirus 2 NEGATIVE NEGATIVE Final    Comment: (NOTE) SARS-CoV-2 target nucleic acids are NOT DETECTED. The SARS-CoV-2 RNA is generally detectable in upper and lower respiratory specimens during the acute phase of infection. Negative results do not preclude SARS-CoV-2 infection, do not rule out co-infections with other pathogens, and should not be used as the sole basis for treatment or other patient management decisions. Negative results must be combined with clinical observations, patient history, and epidemiological information. The expected result is Negative. Fact Sheet for Patients: 05/01/2019 Fact Sheet for Healthcare Providers: HairSlick.no This test is not yet approved or cleared by the quierodirigir.com FDA and  has been authorized for detection and/or diagnosis of SARS-CoV-2 by FDA under an Emergency Use Authorization (EUA). This EUA will remain  in effect (meaning this test can be used) for the duration of the COVID-19 declaration under Section 56 4(b)(1) of the Act, 21  U.S.C. section 360bbb-3(b)(1), unless the authorization is terminated or revoked sooner. Performed at [facility], [address], [place], [place] [address]   MRSA PCR Screening     Status: None   Collection Time: 04/26/2019  6:32 PM   Specimen: Nasopharyngeal  Result Value Ref Range Status   MRSA by PCR NEGATIVE NEGATIVE Final    Comment:        The GeneXpert MRSA Assay (FDA approved for NASAL specimens only), is one component of a comprehensive MRSA colonization surveillance program. It is not intended to diagnose MRSA infection nor to guide or monitor treatment for MRSA infections. Performed at [facility], [address], [place], [place]  [address]          Radiology Studies: No results found.      Scheduled Meds: . acetaminophen  500 mg Oral TID  . aspirin EC  325 mg Oral Q breakfast  . docusate sodium  100 mg Oral BID  . feeding supplement (ENSURE ENLIVE)  237 mL Oral TID BM  . insulin aspart  0-5 Units Subcutaneous QHS  . insulin aspart  0-9 Units Subcutaneous TID WC  . insulin glargine  12 Units Subcutaneous QHS  . metoprolol tartrate  50 mg Oral BID  . multivitamin with minerals  1 tablet Oral Daily  . nystatin  5 mL Oral QID  . pantoprazole  40 mg Oral Daily   Continuous Infusions: . lactated ringers 100 mL/hr at 05/12/19 0041  . methocarbamol (ROBAXIN) IV    . sodium chloride       LOS: 7 days    Time spent: 35 minutes    Alba Cory, MD [facility]  If 7PM-7AM, please contact night-coverage  05/12/2019, 1:38 PM

## 2019-05-12 NOTE — Progress Notes (Signed)
Dressing on the left hip/leg area appears to be moderately saturated with serous type fluid.  Dressing was removed and dressing changed.  Incision appears to be healing, staples in place, no redness or swelling.  Small amount of serous drainage

## 2019-05-12 NOTE — Progress Notes (Signed)
To radiology for ultrasound.

## 2019-05-12 NOTE — Plan of Care (Signed)

## 2019-05-12 NOTE — Progress Notes (Signed)
Physical Therapy Treatment Patient Details Name: Rhonda Hamilton MRN: [id number] DOB: 1928/03/05 Today's Date: 05/12/2019    History of Present Illness 86 yo admitted after fall at ALF with left ulnar fx treated with splint and left intertrochanteric fx s/p IM nail. PMhx: dementia, COPD, osteoporosis, HTN    PT Comments    Pt pleasant and required encouragement to get OOB today but willing once educated for need to increase strength and function to progress toward gait again. Pt tolerating HEP with cues and able to take increased steps toward chair today. Will continue to follow to progress function.     Follow Up Recommendations  SNF;Supervision/Assistance - 24 hour     Equipment Recommendations  Rolling walker with 5" wheels;Other (comment)(left platform)    Recommendations for Other Services       Precautions / Restrictions Precautions Precautions: Fall Required Braces or Orthoses: Splint/Cast Splint/Cast: LUE Restrictions Weight Bearing Restrictions: Yes LUE Weight Bearing: Weight bear through elbow only LLE Weight Bearing: Partial weight bearing LLE Partial Weight Bearing Percentage or Pounds: 50%    Mobility  Bed Mobility Overal bed mobility: Needs Assistance Bed Mobility: Supine to Sit     Supine to sit: Mod assist     General bed mobility comments: mod Assist with HOB 25 degrees with increased time, assist to move LLE, elevate trunk and scoot hips to EOB  Transfers Overall transfer level: Needs assistance   Transfers: Sit to/from [facility] Sit to Stand: Mod assist;+2 physical assistance Stand pivot transfers: +2 physical assistance;Mod assist       General transfer comment: cues for hand placement with hand over hand assist to place LUE on platform. PT with assist to rise and cues for posture and looking up. Pt able to walk grossly 2' to pivot from bed to chair with short sequential shuffles with use of platform walker to maintain  precautions  Ambulation/Gait                 Stairs             Wheelchair Mobility    Modified Rankin (Stroke Patients Only)       Balance Overall balance assessment: Needs assistance Sitting-balance support: Feet unsupported Sitting balance-Leahy Scale: Fair Sitting balance - Comments: pt able to sit EOB with minguard assist   Standing balance support: Bilateral upper extremity supported;During functional activity Standing balance-Leahy Scale: Poor Standing balance comment: bil UE support in standing                            Cognition Arousal/Alertness: Awake/alert Behavior During Therapy: WFL for tasks assessed/performed Overall Cognitive Status: No family/caregiver present to determine baseline cognitive functioning                                 General Comments: pt with history of dementia with pt aware she fell and that she was in the hospital.      Exercises General Exercises - Lower Extremity Long Arc Quad: AROM;AAROM;Seated;Right;Left;10 reps(AAROM on LLE) Hip ABduction/ADduction: AROM;AAROM;Right;Left;Seated;10 reps(AAROM on LLE) Hip Flexion/Marching: AROM;AAROM;Right;Left;Seated;10 reps(AAROM on LLE)    General Comments        Pertinent Vitals/Pain Pain Score: 4  Pain Location: LLE with movement Pain Descriptors / Indicators: Aching;Guarding Pain Intervention(s): Limited activity within patient's tolerance;Monitored during session;Repositioned    Home Living  Prior Function            PT Goals (current goals can now be found in the care plan section) Progress towards PT goals: Progressing toward goals    Frequency    Min 3X/week      PT Plan Current plan remains appropriate;Frequency needs to be updated    Co-evaluation              AM-PAC PT "6 Clicks" Mobility   Outcome Measure  Help needed turning from your back to your side while in a flat bed without  using bedrails?: A Lot Help needed moving from lying on your back to sitting on the side of a flat bed without using bedrails?: A Lot Help needed moving to and from a bed to a chair (including a wheelchair)?: A Lot Help needed standing up from a chair using your arms (e.g., wheelchair or bedside chair)?: A Lot Help needed to walk in hospital room?: A Lot Help needed climbing 3-5 steps with a railing? : Total 6 Click Score: 11    End of Session Equipment Utilized During Treatment: Gait belt Activity Tolerance: Patient tolerated treatment well Patient left: in chair;with call bell/phone within reach;with chair alarm set Nurse Communication: Mobility status;Precautions;Weight bearing status PT Visit Diagnosis: Other abnormalities of gait and mobility (R26.89);Difficulty in walking, not elsewhere classified (R26.2);History of falling (Z91.81)     Time: 1962-2297 PT Time Calculation (min) (ACUTE ONLY): 17 min  Charges:  $Therapeutic Activity: 8-22 mins                     Daysha Ashmore P, PT Acute Rehabilitation Services Pager: [phone] Office: [phone]    Corie Allis B Lia Vigilante 05/12/2019, 1:31 PM

## 2019-05-12 NOTE — Progress Notes (Signed)
Orders for IV bolus- both arms appear edematous and bruised.  Family member is here, assisting with intake of ensure.

## 2019-05-12 NOTE — Progress Notes (Signed)
Patient has poor appetite- Nurse tech sitting with her and feeding her.  The patient refuses most of her tray when assist or offered.

## 2019-05-12 NOTE — Progress Notes (Signed)
Bladder scan:200 mls

## 2019-05-12 NOTE — Progress Notes (Signed)
The daughter in law attempted to assist with feeding patient dinner- fair intake.  Drank 1/2 of ensure

## 2019-05-12 DEATH — deceased

## 2019-05-13 ENCOUNTER — Inpatient Hospital Stay (HOSPITAL_COMMUNITY): Payer: Medicare Other

## 2019-05-13 ENCOUNTER — Encounter (HOSPITAL_COMMUNITY): Payer: Self-pay | Admitting: Anesthesiology

## 2019-05-13 LAB — BASIC METABOLIC PANEL
Anion gap: 11 (ref 5–15)
BUN: 42 mg/dL — ABNORMAL HIGH (ref 8–23)
CO2: 23 mmol/L (ref 22–32)
Calcium: 8.1 mg/dL — ABNORMAL LOW (ref 8.9–10.3)
Chloride: 105 mmol/L (ref 98–111)
Creatinine, Ser: 1.79 mg/dL — ABNORMAL HIGH (ref 0.44–1.00)
GFR calc Af Amer: 29 mL/min — ABNORMAL LOW (ref >60)
GFR calc non Af Amer: 25 mL/min — ABNORMAL LOW (ref >60)
Glucose, Bld: 142 mg/dL — ABNORMAL HIGH (ref 70–99)
Potassium: 4.4 mmol/L (ref 3.5–5.1)
Sodium: 139 mmol/L (ref 135–145)

## 2019-05-13 LAB — GLUCOSE, CAPILLARY
Glucose-Capillary: 107 mg/dL — ABNORMAL HIGH (ref 70–99)
Glucose-Capillary: 131 mg/dL — ABNORMAL HIGH (ref 70–99)
Glucose-Capillary: 88 mg/dL (ref 70–99)
Glucose-Capillary: 76 mg/dL (ref 70–99)

## 2019-05-13 LAB — CBC
HCT: 29.4 % — ABNORMAL LOW (ref 36.0–46.0)
Hemoglobin: 9.1 g/dL — ABNORMAL LOW (ref 12.0–15.0)
MCH: 28.2 pg (ref 26.0–34.0)
MCHC: 31 g/dL (ref 30.0–36.0)
MCV: 91 fL (ref 80.0–100.0)
Platelets: 231 10*3/uL (ref 150–400)
RBC: 3.23 MIL/uL — ABNORMAL LOW (ref 3.87–5.11)
RDW: 16.2 % — ABNORMAL HIGH (ref 11.5–15.5)
WBC: 12.3 10*3/uL — ABNORMAL HIGH (ref 4.0–10.5)
nRBC: 0.5 % — ABNORMAL HIGH (ref 0.0–0.2)

## 2019-05-13 MED ORDER — SODIUM CHLORIDE 0.9 % IV SOLN
1.0000 g | INTRAVENOUS | Status: DC
  Administered 2019-05-13: 1 g via INTRAVENOUS
  Filled 2019-05-13: qty 10

## 2019-05-13 MED ORDER — FLEET ENEMA 7-19 GM/118ML RE ENEM
1.0000 | ENEMA | Freq: Once | RECTAL | Status: AC
  Administered 2019-05-13: 1 via RECTAL
  Filled 2019-05-13: qty 1

## 2019-05-13 NOTE — Consult Note (Signed)
Reason for Consult: Acute Renal Failure, Large Left Distal Ureteral Stone  Referring Physician: Hartley Barefoot MD  Rhonda Hamilton is an 86 y.o. female.    HPI:   1 - Acute Renal Failure - baseline Cr 1.2 with rise to 1.8 during admission for hip fracture. Some poor PO intake as well. Renal US 2/1 with some significant left hydro. CT 2/2 with large left distal ureteral stone.   2 - Large Left Distal Ureteral Stone- 56mm left distal stone by CT 05/13/19. No additional stones.   PMH sig for frailty/dementia/lives [place] at baseline.   Today " Rhonda Hamilton" is seen in consult for above. She is currently admitted after fall and left hip fracture surgery. She is making very slow recovery and actually declining functionally at present.   Past Medical History:  Diagnosis Date  . Atrial fibrillation, chronic (HCC) 05/09/2019  . COPD (chronic obstructive pulmonary disease) (HCC)   . Dementia (HCC)   . Diabetes mellitus (HCC)   . Hypertension   . Osteoporosis     Past Surgical History:  Procedure Laterality Date  . hysterectomy----unknown    . INTRAMEDULLARY (IM) NAIL INTERTROCHANTERIC Left May 29, 2019   Procedure: left trochanteric nail for intertrochanteric hip fracture;  Surgeon: Eldred Manges, MD;  Location: [facility] OR;  Service: Orthopedics;  Laterality: Left;    Family History  Problem Relation Age of Onset  . Diabetes Mother   . Hypertension Mother     Social History:  reports that she has quit smoking. She has never used smokeless tobacco. She reports that she does not drink alcohol or use drugs.  Allergies: No Known Allergies  Medications: I have reviewed the patient's current medications.  Results for orders placed or performed during the hospital encounter of 04/26/2019 (from the past 48 hour(s))  Glucose, capillary     Status: Abnormal   Collection Time: 05/11/19  4:25 PM  Result Value Ref Range   Glucose-Capillary 144 (H) 70 - 99 mg/dL  Glucose, capillary     Status:  Abnormal   Collection Time: 05/11/19  9:47 PM  Result Value Ref Range   Glucose-Capillary 121 (H) 70 - 99 mg/dL  Glucose, capillary     Status: Abnormal   Collection Time: 05/12/19  7:00 AM  Result Value Ref Range   Glucose-Capillary 100 (H) 70 - 99 mg/dL  CBC     Status: Abnormal   Collection Time: 05/12/19  8:36 AM  Result Value Ref Range   WBC 13.5 (H) 4.0 - 10.5 K/uL   RBC 3.25 (L) 3.87 - 5.11 MIL/uL   Hemoglobin 9.2 (L) 12.0 - 15.0 g/dL   HCT 34.7 (L) 42.5 - 95.6 %   MCV 92.3 80.0 - 100.0 fL   MCH 28.3 26.0 - 34.0 pg   MCHC 30.7 30.0 - 36.0 g/dL   RDW 38.7 56.4 - 33.2 %   Platelets 248 150 - 400 K/uL   nRBC 0.6 (H) 0.0 - 0.2 %    Comment: Performed at [facility], [address] [place], [place] [address]  Basic metabolic panel     Status: Abnormal   Collection Time: 05/12/19  8:36 AM  Result Value Ref Range   Sodium 140 135 - 145 mmol/L   Potassium 4.9 3.5 - 5.1 mmol/L    Comment: SLIGHT HEMOLYSIS   Chloride 104 98 - 111 mmol/L   CO2 27 22 - 32 mmol/L   Glucose, Bld 112 (H) 70 - 99 mg/dL   BUN 40 (H)  8 - 23 mg/dL   Creatinine, Ser 9.47 (H) 0.44 - 1.00 mg/dL   Calcium 8.5 (L) 8.9 - 10.3 mg/dL   GFR calc non Af Amer 23 (L) >60 mL/min   GFR calc Af Amer 27 (L) >60 mL/min   Anion gap 9 5 - 15    Comment: Performed at [facility], [address] [place], [place] [address]  Glucose, capillary     Status: Abnormal   Collection Time: 05/12/19 12:23 PM  Result Value Ref Range   Glucose-Capillary 127 (H) 70 - 99 mg/dL  Glucose, capillary     Status: Abnormal   Collection Time: 05/12/19  4:09 PM  Result Value Ref Range   Glucose-Capillary 163 (H) 70 - 99 mg/dL  Glucose, capillary     Status: Abnormal   Collection Time: 05/12/19  8:47 PM  Result Value Ref Range   Glucose-Capillary 198 (H) 70 - 99 mg/dL  Glucose, capillary     Status: Abnormal   Collection Time: 05/13/19  6:39 AM  Result Value Ref Range   Glucose-Capillary 107 (H) 70 - 99 mg/dL  CBC      Status: Abnormal   Collection Time: 05/13/19  7:45 AM  Result Value Ref Range   WBC 12.3 (H) 4.0 - 10.5 K/uL   RBC 3.23 (L) 3.87 - 5.11 MIL/uL   Hemoglobin 9.1 (L) 12.0 - 15.0 g/dL   HCT 36.6 (L) 29.4 - 76.5 %   MCV 91.0 80.0 - 100.0 fL   MCH 28.2 26.0 - 34.0 pg   MCHC 31.0 30.0 - 36.0 g/dL   RDW 46.5 (H) 03.5 - 46.5 %   Platelets 231 150 - 400 K/uL   nRBC 0.5 (H) 0.0 - 0.2 %    Comment: Performed at [facility], [address] [place], [place] [address]  Basic metabolic panel     Status: Abnormal   Collection Time: 05/13/19  7:45 AM  Result Value Ref Range   Sodium 139 135 - 145 mmol/L   Potassium 4.4 3.5 - 5.1 mmol/L   Chloride 105 98 - 111 mmol/L   CO2 23 22 - 32 mmol/L   Glucose, Bld 142 (H) 70 - 99 mg/dL   BUN 42 (H) 8 - 23 mg/dL   Creatinine, Ser 5.17 (H) 0.44 - 1.00 mg/dL   Calcium 8.1 (L) 8.9 - 10.3 mg/dL   GFR calc non Af Amer 25 (L) >60 mL/min   GFR calc Af Amer 29 (L) >60 mL/min   Anion gap 11 5 - 15    Comment: Performed at [facility], [address], [place], [place] [address]  Glucose, capillary     Status: Abnormal   Collection Time: 05/13/19 11:43 AM  Result Value Ref Range   Glucose-Capillary 131 (H) 70 - 99 mg/dL    US RENAL  Result Date: 05/12/2019 CLINICAL DATA:  86 year old female with acute renal insufficiency and elevated BUN and creatinine. EXAM: RENAL / URINARY TRACT ULTRASOUND COMPLETE COMPARISON:  CT abdomen pelvis dated 10/12/2017. FINDINGS: Right Kidney: Renal measurements: 8.2 x 4.9 x 3.8 cm = volume: 78 ML. The right kidney is mildly atrophic and echogenic. No hydronephrosis or shadowing stone. Left Kidney: Renal measurements: 10.1 x 5.4 x 4.6 cm = volume: 131 mL. There is a mild to moderate left hydronephrosis. No shadowing stone identified. Normal echogenicity. Bladder: Appears normal for degree of bladder distention. Bilateral ureteral jets noted. Other: Evaluation is limited due to patient's body habitus  and overlying bowel gas.  IMPRESSION: Mild-to-moderate left hydronephrosis. Electronically Signed   By: Anner Crete M.D.   On: 05/12/2019 20:08    Review of Systems  Unable to perform ROS: Mental status change   Blood pressure (!) 141/90, pulse (!) 107, temperature 97.9 F (36.6 C), resp. rate 14, height 5\' 4"  (1.626 m), weight 68.8 kg, SpO2 93 %. Physical Exam  Constitutional:  Frail, elderly, stigmata of chronic disese and diffuse edema.   HENT:  Minimal dentitision  Cardiovascular: Normal rate.  Respiratory:  Non-labored on RA  GI:  Mild abd distension w/o r/g.   Genitourinary:    Vagina normal.     Genitourinary Comments: NO grimace with flank palpation   Musculoskeletal:     Cervical back: Normal range of motion.     Comments: Left hip scar. Four extremity edema.   Neurological:  Responds to name, but otherwise AOx0.   Skin: Skin is warm.    Assessment/Plan:  1 - Acute Renal Failure - likely multifactorial with some pre-renal insult as well as unilateral left obstruction form stone below. Family desires stone treatment.   2 - Large Left Distal Ureteral Stone- Options discussed with pt and family (Pt's only child, son Rhonda Hamilton at [phone])  including observation (essentially 0 chance of medical passage) or left ureteroscopy, preferably this admission with goal of stone free and relieve left obstruction. We discussed her current acute functional decline and poor health and significantly increased peri-operative risk including death. He voiced understanding and desire to proceed likely tomorrow afternoon. Agree with Rocephin now for peri-op proph.   Alexis Frock 05/13/2019, 4:18 PM

## 2019-05-13 NOTE — Anesthesia Postprocedure Evaluation (Signed)
Anesthesia Post Note  Patient: Rhonda Hamilton  Procedure(s) Performed: left trochanteric nail for intertrochanteric hip fracture (Left )     Patient location during evaluation: PACU Anesthesia Type: General Level of consciousness: awake and patient cooperative Pain management: pain level controlled Vital Signs Assessment: post-procedure vital signs reviewed and stable Respiratory status: spontaneous breathing, nonlabored ventilation, respiratory function stable and patient connected to nasal cannula oxygen Cardiovascular status: blood pressure returned to baseline and stable Postop Assessment: no apparent nausea or vomiting Anesthetic complications: no    Last Vitals:  Vitals:   05/13/19 0822 05/13/19 0922  BP: (!) 141/90 (!) 141/90  Pulse: (!) 107 (!) 107  Resp: 14   Temp:    SpO2: 93%     Last Pain:  Vitals:   05/13/19 0730  TempSrc:   PainSc: Asleep                 Blonnie Maske

## 2019-05-13 NOTE — Progress Notes (Signed)
OT Cancellation Note  Patient Details Name: Macil Crady MRN: [id number] DOB: [birth year]   Cancelled Treatment:    Reason Eval/Treat Not Completed: Other (comment)(Family stating that pt did not sleep well last night.)  OT to continue to follow-up with pt at next available treatment day.  Flora Lipps OTR/L Acute Rehabilitation Services Pager: [phone] Office: [phone]   Samai Corea C 05/13/2019, 10:53 AM

## 2019-05-13 NOTE — Progress Notes (Signed)
Communicated with Dr Sunnie Nielsen in regards to the patient being less responsive this morning, having difficulty taking po medication and having increased edema in upper extremities.

## 2019-05-13 NOTE — Progress Notes (Signed)
IV team has been consulted for New IV placement

## 2019-05-13 NOTE — Progress Notes (Signed)
Portable chest x ray done.  IV fluids decreased to 75 mls/hr

## 2019-05-13 NOTE — Progress Notes (Signed)
OT Cancellation Note  Patient Details Name: Rhonda Hamilton MRN: [id number] DOB: 1928/03/20   Cancelled Treatment:    Reason Eval/Treat Not Completed: Attempt to see patient this afternoon however sleeping very soundly, unable to arouse. Will re-attempt as appropriate.   Myrtie Neither OT OT office: [phone]   Carmelia Roller 05/13/2019, 1:08 PM

## 2019-05-13 NOTE — Progress Notes (Signed)
Dr Sunnie Nielsen was sent a test message through epic with an update on the patient's continued decrease alertness and response with congested cough.

## 2019-05-13 NOTE — Progress Notes (Signed)
Patient look more lethargic than before, arouse when you call her name and  responds as well during repositioning in bed, fleet Enema given ,she  spit all her night medicine out and has been sleeping since shift began, O2 was down to upper 80s so nurse put her on 2 L nasal cannula, now in lower 90s, vital signs stable  Will page attending doctor and continue to monitor.

## 2019-05-13 NOTE — Progress Notes (Signed)
Dr Sunnie Nielsen has been made aware of the patient's family member calling and are contemplating if they will agree to surgery.  Night shift nurse Alona Bene is aware of this information.

## 2019-05-13 NOTE — Progress Notes (Addendum)
PROGRESS NOTE    Rhonda Hamilton  DGL:[id number] DOB: May 28, 1927 DOA: 05/04/2019 PCP: Patient, No Pcp Per   Brief Narrative: 86 year old resident at [facility] who had a fall that was followed by severe left hip pain and inability to walk.  Patient with past medical history significant for dementia, diabetes, hypertension, osteoporosis and COPD.  She was normally ambulatory with a walker.  She was found she had a left intertrochanteric hip fracture.  Wrist x-ray show an ulnar styloid fracture.  She was transferred to [facility] for surgery. Patient had surgery in the left hip on 1/27.  PT recommended skilled nursing facility. Hospital course  complicated by poor oral intake delirium and constipation, AKI. Left side hydronephrosis. Urology consulted, plan to get CT renal protocol.   If patient fail to improve will need to consider palliative and comfort care.   Assessment & Plan:   Principal Problem:   Closed intertrochanteric fracture of hip, left, initial encounter [facility]) Active Problems:   Traumatic closed fracture of ulnar styloid with minimal displacement, left, initial encounter   Hypertension   Diabetes mellitus (HCC)   Dementia (HCC)   COPD (chronic obstructive pulmonary disease) (HCC)   CKD (chronic kidney disease), stage III   Atrial fibrillation, chronic (HCC)  1-Closed intertrochanteric fracture of the left Patient underwent left trochanteric nail fixation and comminuted left intertrochanteric hip fracture on 1/27 by Dr. Ophelia Charter.  PT recommend SNF Pain management, narcotic has been discontinued because patient was more confuse. On scheduled Tylenol  Leukocytosis. Trending down. Afebrile. WBC stable.  UA with not significant WBC>   Encephalopathy; acute metabolic, delirium; Suspect related to acute illness, hospitalization.  Patient didn't sleep the night of 2-01. I came  To see patient , she is sleepy, would moan few words. Keeps eyes close.  Nurse concern about  congestion, sat stable, ronchus on exam. Will check Chest x ray,. Decrease IV fluids.   AKI: Chronic kidney disease a stage III, prior cr 1.1. Due to poor oral intake. Continue with IV fluids. Cr up to 1.7. renal US showed left side hydronephrosis.  Urology consulted, plan to proceed with CT stone protocol.   Addendum;  Left Ureteric Stone; urology will discussed with  Family options.  Added ceftriaxone in case infection. Check urine culture.   Fecal impaction.; will order enema.   Pleural effusion; decrease IV fluids rate.   Traumatic closed fracture of the ulnar styloid with minimal displacement of the left: Splinting Ortho following  Nutrition; poor oral intake.  Ensure order.   Constipation: Per nurse report patient had bowel movement.  We will continue with laxative.   Hypertension: Continue with metoprolol twice daily  Diabetes: Continue with a sliding scale  Dementia, acute delirium She was notice to be more confuse yesterday.  Morphine and m=norco discontinue  Patient sleepy this am, she was not able to sleep last night per report.   A. fib.  Stable   Nutrition Problem: Increased nutrient needs Etiology: post-op healing    Signs/Symptoms: estimated needs    Interventions: Ensure Enlive (each supplement provides 350kcal and 20 grams of protein), MVI, Magic cup  Estimated body mass index is 26.04 kg/m as calculated from the following:   Height as of this encounter: 5\' 4"  (1.626 m).   Weight as of this encounter: 68.8 kg.   DVT prophylaxis: Aspirin Code Status: DNR Family Communication: son updated 2-01 Disposition Plan:  -Patient from [place] living facility. -Patient to be discharged to skilled nursing facility. -Barriers to discharge: Include  poor oral intake, worsening renal function, requiring IV fluids. CT renal pending.   Discussed with daughter in law today, if patient fail to improved, patient will need palliative care approach    Consultants:   Ortho  Procedures:   ORIF  left hip 20/27/21  Antimicrobials:    Subjective: Patient sleepy today, she was awake all night.  Per daughter in law IV fluids was not hag last night.    Objective: Vitals:   05/12/19 2308 05/13/19 0431 05/13/19 0822 05/13/19 0922  BP: 97/66 133/88 (!) 141/90 (!) 141/90  Pulse: 84 99 (!) 107 (!) 107  Resp:  18 14   Temp:  97.9 F (36.6 C)    TempSrc:      SpO2:  94% 93%   Weight:      Height:        Intake/Output Summary (Last 24 hours) at 05/13/2019 8469 Last data filed at 05/13/2019 0500 Gross per 24 hour  Intake 180 ml  Output 500 ml  Net -320 ml   Filed Weights   05/06/19 1138  Weight: 68.8 kg    Examination:  General exam: NAD Respiratory system: CTA Cardiovascular system: S 1, S 1, RRR Gastrointestinal system: BS present, soft, nt Central nervous system: sleepy  Extremities: Trace edema, dressing left hip, splinter in left hand.   Data Reviewed: I have personally reviewed following labs and imaging studies  CBC: Recent Labs  Lab 05/09/19 0237 05/10/19 0431 05/11/19 0652 05/12/19 0836 05/13/19 0745  WBC 13.4* 12.5* 12.5* 13.5* 12.3*  HGB 8.8* 8.8* 8.6* 9.2* 9.1*  HCT 28.2* 28.2* 28.1* 30.0* 29.4*  MCV 89.8 90.1 90.4 92.3 91.0  PLT 211 210 208 248 231   Basic Metabolic Panel: Recent Labs  Lab 05/09/19 0237 05/10/19 0431 05/11/19 0652 05/12/19 0836 05/13/19 0745  NA 141 139 139 140 139  K 4.9 4.7 4.6 4.9 4.4  CL 105 102 105 104 105  CO2 26 25 25 27 23   GLUCOSE 134* 93 96 112* 142*  BUN 45* 45* 44* 40* 42*  CREATININE 1.68* 1.59* 1.74* 1.87* 1.79*  CALCIUM 8.5* 8.5* 8.4* 8.5* 8.1*   GFR: Estimated Creatinine Clearance: 20.3 mL/min (A) (by C-G formula based on SCr of 1.79 mg/dL (H)). Liver Function Tests: No results for input(s): AST, ALT, ALKPHOS, BILITOT, PROT, ALBUMIN in the last 168 hours. No results for input(s): LIPASE, AMYLASE in the last 168 hours. No results for input(s):  AMMONIA in the last 168 hours. Coagulation Profile: No results for input(s): INR, PROTIME in the last 168 hours. Cardiac Enzymes: No results for input(s): CKTOTAL, CKMB, CKMBINDEX, TROPONINI in the last 168 hours. BNP (last 3 results) No results for input(s): PROBNP in the last 8760 hours. HbA1C: No results for input(s): HGBA1C in the last 72 hours. CBG: Recent Labs  Lab 05/12/19 0700 05/12/19 1223 05/12/19 1609 05/12/19 2047 05/13/19 0639  GLUCAP 100* 127* 163* 198* 107*   Lipid Profile: No results for input(s): CHOL, HDL, LDLCALC, TRIG, CHOLHDL, LDLDIRECT in the last 72 hours. Thyroid Function Tests: No results for input(s): TSH, T4TOTAL, FREET4, T3FREE, THYROIDAB in the last 72 hours. Anemia Panel: No results for input(s): VITAMINB12, FOLATE, FERRITIN, TIBC, IRON, RETICCTPCT in the last 72 hours. Sepsis Labs: No results for input(s): PROCALCITON, LATICACIDVEN in the last 168 hours.  Recent Results (from the past 240 hour(s))  SARS CORONAVIRUS 2 (TAT 6-24 HRS) Nasopharyngeal     Status: None   Collection Time: 05/01/2019  5:12 PM   Specimen: Nasopharyngeal  Result Value Ref Range Status   SARS Coronavirus 2 NEGATIVE NEGATIVE Final    Comment: (NOTE) SARS-CoV-2 target nucleic acids are NOT DETECTED. The SARS-CoV-2 RNA is generally detectable in upper and lower respiratory specimens during the acute phase of infection. Negative results do not preclude SARS-CoV-2 infection, do not rule out co-infections with other pathogens, and should not be used as the sole basis for treatment or other patient management decisions. Negative results must be combined with clinical observations, patient history, and epidemiological information. The expected result is Negative. Fact Sheet for Patients: SugarRoll.be Fact Sheet for Healthcare Providers: https://www.woods-mathews.com/ This test is not yet approved or cleared by the [place] FDA and    has been authorized for detection and/or diagnosis of SARS-CoV-2 by FDA under an Emergency Use Authorization (EUA). This EUA will remain  in effect (meaning this test can be used) for the duration of the COVID-19 declaration under Section 56 4(b)(1) of the Act, 21 U.S.C. section 360bbb-3(b)(1), unless the authorization is terminated or revoked sooner. Performed at [facility], [place] [address] [place], [place] [address]   MRSA PCR Screening     Status: None   Collection Time: 05/17/19  6:32 PM   Specimen: Nasopharyngeal  Result Value Ref Range Status   MRSA by PCR NEGATIVE NEGATIVE Final    Comment:        The GeneXpert MRSA Assay (FDA approved for NASAL specimens only), is one component of a comprehensive MRSA colonization surveillance program. It is not intended to diagnose MRSA infection nor to guide or monitor treatment for MRSA infections. Performed at [facility], [place] [address] [place], [place] [address]          Radiology Studies: US RENAL  Result Date: 05/12/2019 CLINICAL DATA:  86 year old female with acute renal insufficiency and elevated BUN and creatinine. EXAM: RENAL / URINARY TRACT ULTRASOUND COMPLETE COMPARISON:  CT abdomen pelvis dated 10/12/2017. FINDINGS: Right Kidney: Renal measurements: 8.2 x 4.9 x 3.8 cm = volume: 78 ML. The right kidney is mildly atrophic and echogenic. No hydronephrosis or shadowing stone. Left Kidney: Renal measurements: 10.1 x 5.4 x 4.6 cm = volume: 131 mL. There is a mild to moderate left hydronephrosis. No shadowing stone identified. Normal echogenicity. Bladder: Appears normal for degree of bladder distention. Bilateral ureteral jets noted. Other: Evaluation is limited due to patient's body habitus and overlying bowel gas. IMPRESSION: Mild-to-moderate left hydronephrosis. Electronically Signed   By: Anner Crete M.D.   On: 05/12/2019 20:08        Scheduled Meds: . acetaminophen  500 mg Oral TID  . aspirin EC   325 mg Oral Q breakfast  . docusate sodium  100 mg Oral BID  . feeding supplement (ENSURE ENLIVE)  237 mL Oral TID BM  . insulin aspart  0-5 Units Subcutaneous QHS  . insulin aspart  0-9 Units Subcutaneous TID WC  . insulin glargine  12 Units Subcutaneous QHS  . metoprolol tartrate  50 mg Oral BID  . multivitamin with minerals  1 tablet Oral Daily  . nystatin  5 mL Oral QID  . pantoprazole  40 mg Oral Daily  . sodium chloride flush  10-40 mL Intracatheter Q12H   Continuous Infusions: . lactated ringers 100 mL/hr at 05/13/19 0732     LOS: 8 days    Time spent: 35 minutes    Elmarie Shiley, MD [facility]  If 7PM-7AM, please contact night-coverage  05/13/2019, 9:58 AM

## 2019-05-13 NOTE — Progress Notes (Signed)
Patient transported via bed to CT 

## 2019-05-13 NOTE — TOC Progression Note (Addendum)
Transition of Care [facility]) - Progression Note    Patient Details  Name: Rhonda Hamilton MRN: [id number] Date of Birth: 1927-12-21  [facility] CM/SW Contact  Bess Kinds, RN Phone Number: [phone] 05/13/2019, 10:21 AM  Clinical Narrative:    Clinical information faxed to [facility] MUST yesterday. 30 day note signed and faxed to [facility] MUST this am. PASRR pending level 2 review.   [facility] extended bed offer. Spoke with Elease Hashimoto in admissions to verify bed offer. [facility] has a relationship with [name] where patient resides.   Update: PASRR received, [id number] E. Notified facility.   MD advised that patient not medically ready for transition to SNF today. Patient will need covid when ready to transition.   Spoke with son, Tamyrah Burbage, to update on transition planning.   TOC following.    Expected Discharge Plan: Skilled Nursing Facility Barriers to Discharge: Continued Medical Work up  Expected Discharge Plan and Services Expected Discharge Plan: Skilled Nursing Facility In-house Referral: Clinical Social Work Discharge Planning Services: CM Consult Post Acute Care Choice: Nursing Home Living arrangements for the past 2 months: Assisted Living Facility                                       Social Determinants of Health (SDOH) Interventions    Readmission Risk Interventions No flowsheet data found.

## 2019-05-14 ENCOUNTER — Inpatient Hospital Stay (HOSPITAL_COMMUNITY): Payer: Medicare Other

## 2019-05-14 LAB — BLOOD GAS, ARTERIAL
Acid-Base Excess: 2.3 mmol/L — ABNORMAL HIGH (ref 0.0–2.0)
Bicarbonate: 27 mmol/L (ref 20.0–28.0)
Drawn by: 24513
FIO2: 30
O2 Saturation: 82.6 %
Patient temperature: 37
pCO2 arterial: 47.4 mmHg (ref 32.0–48.0)
pH, Arterial: 7.374 (ref 7.350–7.450)
pO2, Arterial: 49.6 mmHg — ABNORMAL LOW (ref 83.0–108.0)

## 2019-05-14 SURGERY — CYSTOSCOPY/URETEROSCOPY/HOLMIUM LASER/STENT PLACEMENT
Anesthesia: General | Laterality: Left

## 2019-05-14 MED ORDER — IPRATROPIUM-ALBUTEROL 0.5-2.5 (3) MG/3ML IN SOLN
3.0000 mL | RESPIRATORY_TRACT | Status: DC | PRN
  Administered 2019-05-14: 3 mL via RESPIRATORY_TRACT
  Filled 2019-05-14: qty 3

## 2019-06-09 NOTE — Progress Notes (Signed)
Responded to request from nurse for Chaplain to be with Rhonda Hamilton's family who had arrived.  Offered ministry of presence with son and daughter in law, hearing their loving stories of this beautiful lady.  Answered some questions and helped them think through arrangements for her burial beside her husband.  Recited Psalm 23 and offered prayer.   Vernell Morgans Chaplain Resident

## 2019-06-09 NOTE — Progress Notes (Signed)
Patient stopped breathing at 0400 when I went in to check on her, confirmed by another RN vee, doctor paged, waiting for response , family called and they are on the way to the hospital, chaplain was here and aware, pt given complete bed bath . Will continue to follow protocol and standard of work. Thanks!

## 2019-06-09 NOTE — Progress Notes (Signed)
Patient expired at 0400,confirmed with another RN Renard Hamper at [phone], Dr. Paged and notified,chaplain aware said she will come back when family get here, family ( son Bee Marchiano) notified and on his way coming to the hospital, [place] donor called, Medical examiner also called, all standard protocol followed, post mortem care done already, waiting for the family to get here before sending the body to the morgue, will wait, monitor and update the incoming nurse. Thank you!

## 2019-06-09 NOTE — Progress Notes (Signed)
Responded to referral from [facility] NP saying Mrs. Kreitzer was near the end of her life.  When I arrived she just passed.  Spoke with nurse who had been with her at the time of her death.  Family had not yet been notified.  Requested nurse to call me if family was coming and wanted a Chaplain once they arrived  Vernell Morgans Chaplain Resident

## 2019-06-09 NOTE — Progress Notes (Signed)
Talked with Donnamarie Poag NP on the phone and she is okay with attempting to NTS patient and help relieve some of her WOB. Patient had BBS with Rhonchi and Some Crackels throughout. Post Suctioning patient has BBS with a strong exhalation causing exp wheezing sounds. WOB still the same but removed a moderate amount of thick frothy red/brown/yellow secretions on one attempt. Patient tolerated well, no fighting at all. Patient not tolerating HFNC at 10 L or 15 due to mouth breathing, so changed patient to a NRB mask at 15L with removal of the inside flap helping dilute O2 some. Patient currently sating 85-90% and tolerating well. WOB the same.

## 2019-06-09 NOTE — Progress Notes (Signed)
Examined patient after drawing ABG and patient is very lethargic, has BBS with Rhonchi/Crackles throughout. Patient is breathing at a rate of about 20 with good movement of air, just not very responsive. With patient being unable to respond to commands and so lethargic, BiPAP doesn't seem to be an option at this point on the floor. Waiting for Results from ABG and MD to see what next move is. Patient is currently a DNR with multiple issues.

## 2019-06-09 NOTE — Progress Notes (Signed)
RN paged NP earlier to say pt was more lethargic, spit out her hs meds, and SaO2 in 80s. RN placed her on 2L O2 per [place] and O2 sat came up to 90. NP ordered an ABG. ABG is fine except pt is hypoxic with a PO2 in the 40s. RT placed pt on 6L HFNC then increased it to 10L HF. NP spoke with RT and he stated pt needed to be suctioned, so NP said OK. Also, RT questions accuracy of oximetry because there is not a good waveform. This NP, being at other hospital, called colleague, Katherina Right NP, to go to bedside. Pt is a DNR, but family may need to be called.  CXR was ? atelectasis vs opacity yesterday, so will r/p that. Also, has AKI and will recheck that to r/o uremia. CBC ordered as well to check WBCC as was elevated yesterday.  KJKG, NP [facility]

## 2019-06-09 NOTE — Progress Notes (Signed)
Pt belongings returned to son. Pt transported to morgue.

## 2019-06-09 NOTE — Progress Notes (Signed)
ABG done and sent to lab at 0120.

## 2019-06-09 NOTE — Progress Notes (Signed)
Saw pt with RT.  Increased WOB with accessory muscle use.  Breath souonds with crackles throughout and expiratory wheezes.  CXR and stat labs pending.  NRB mask at 15L in place.  CXR showed worsening opacities.  04:25 Spoke with son Naphtali Riede at 04:25 and advised his mother had passed.   He has someone with him and will be coming to the hospital shortly.  Chaplain resident came to see patient shortly before death.

## 2019-06-09 NOTE — Discharge Summary (Addendum)
Death Summary  Rhonda Hamilton HCW:[id number] DOB: May 21, 1927 DOA: 01-Jun-2019  PCP: Rhonda Hamilton, No Pcp Per   Admit date: 06/01/19 Date of Death: Jun 18, 2019  Final Diagnoses:  Principal Problem:   Closed intertrochanteric fracture of hip, left, initial encounter [facility]) Active Problems:   Traumatic closed fracture of ulnar styloid with minimal displacement, left, initial encounter   Hypertension   Diabetes mellitus ([place])   Dementia (HCC)   COPD (chronic obstructive pulmonary disease) ([place])   CKD (chronic kidney disease), stage III   Atrial fibrillation, chronic (HCC) Acute hypoxic respiratory failure.     History of present illness:  86 year old resident at [facility] who had a fall that was followed by severe left hip pain and inability to walk.  Rhonda Hamilton with past medical history significant for dementia, diabetes, hypertension, osteoporosis and COPD.  She was normally ambulatory with a walker.  She was found she had a left intertrochanteric hip fracture.  Wrist x-ray show an ulnar styloid fracture.  She was transferred to [facility] for surgery. Rhonda Hamilton had surgery in the left hip on 1/27.  PT recommended skilled nursing facility. Hospital course  complicated by poor oral intake delirium and constipation, AKI. Left side hydronephrosis. Urology consulted, plan to get CT renal protocol.   If Rhonda Hamilton fail to improve will need to consider palliative and comfort care, discussed with daughter in law morning of 84-05-2019.  Rhonda Hamilton was subsequently diagnosed with hydronephrosis , urethral stone. Urology was consulted, family was contemplating about intervention. Rhonda Hamilton became more congested, and lethargic.  I  Evaluated Rhonda Hamilton in the afternoon,  she would moan few words . Chest x ray showed atelectasis. And IV fluids were decreased.  Later during the night Rhonda Hamilton became more hypoxic, and lethargic, suspect she aspirated. She had increase oxygen requirement. Rhonda Hamilton was evaluated by Night team.  She was suction. Subsequently Rhonda Hamilton stop breathing. Rhonda Hamilton was pronounced death at 4;09 am Mar [birth year], 2021.  Hospital Course:   1-Closed intertrochanteric fracture of the left Rhonda Hamilton underwent left trochanteric nail fixation and comminuted left intertrochanteric hip fracture on 1/27 by Dr. Lorin Mercy.  PT recommend SNF Pain management, narcotic has been discontinued because Rhonda Hamilton was more confuse. On scheduled Tylenol  Leukocytosis. Trending down. Afebrile. WBC stable.  UA with not significant WBC>   Acute Hypoxic respiratory failure;  Rhonda Hamilton develops Acute hypoxic respiratory failure, overnight. She was place on HF oxygen, was suction.  Could have been related to aspiration event.   Encephalopathy; acute metabolic, delirium; Suspect related to acute illness, hospitalization.  Rhonda Hamilton didn't sleep the night of 2-01. I came  To see Rhonda Hamilton , she is sleepy, would moan few words. Keeps eyes close.  Nurse concern about congestion, sat stable, ronchus on exam. Chest x ray was ordered showed atelectasis and IV fluids was decreased.   AKI: Chronic kidney disease a stage III, prior cr 1.1. Due to poor oral intake. Continue with IV fluids. Cr up to 1.7. renal US showed left side hydronephrosis.  Urology consulted, plan to proceed with CT stone protocol.   Addendum;  Left Ureteric Stone; urology will discussed with  Family options.  Added ceftriaxone in case infection. Urine culture was ordered.    Fecal impaction.; enema ordered.   Pleural effusion; decrease IV fluids rate.   Traumatic closed fracture of the ulnar styloid with minimal displacement of the left: Splinting Ortho following  Nutrition; poor oral intake.  Ensure order.   Constipation: Per nurse report Rhonda Hamilton had bowel movement.  We will continue with laxative.   Hypertension: Continue  with metoprolol twice daily  Diabetes: Continue with a sliding scale  Dementia, acute delirium She was notice to be  more confuse yesterday.  Morphine and norco discontinue  Rhonda Hamilton sleepy this am, she was not able to sleep last night per report.   A. fib.  Stable    Time: 35 minutes  Signed:  Rosia Syme A Jaman Aro  [facility] 05/22/2019, 5:23 PM

## 2019-06-09 NOTE — Progress Notes (Signed)
Assigned nurse still waiting for patient family to get here to pay their last respect and the attending Dr. to sign the Death Certificate before sending the body to the morgue, will continue to monitor thanks!

## 2019-06-09 DEATH — deceased

## 2020-04-09 IMAGING — DX DG CHEST 1V PORT
1 series · 1 of 1 positions shown · non-contrast
Comparison: 05/13/2019

CLINICAL DATA: Hypoxia

EXAM:
PORTABLE CHEST 1 VIEW

[chest ap]
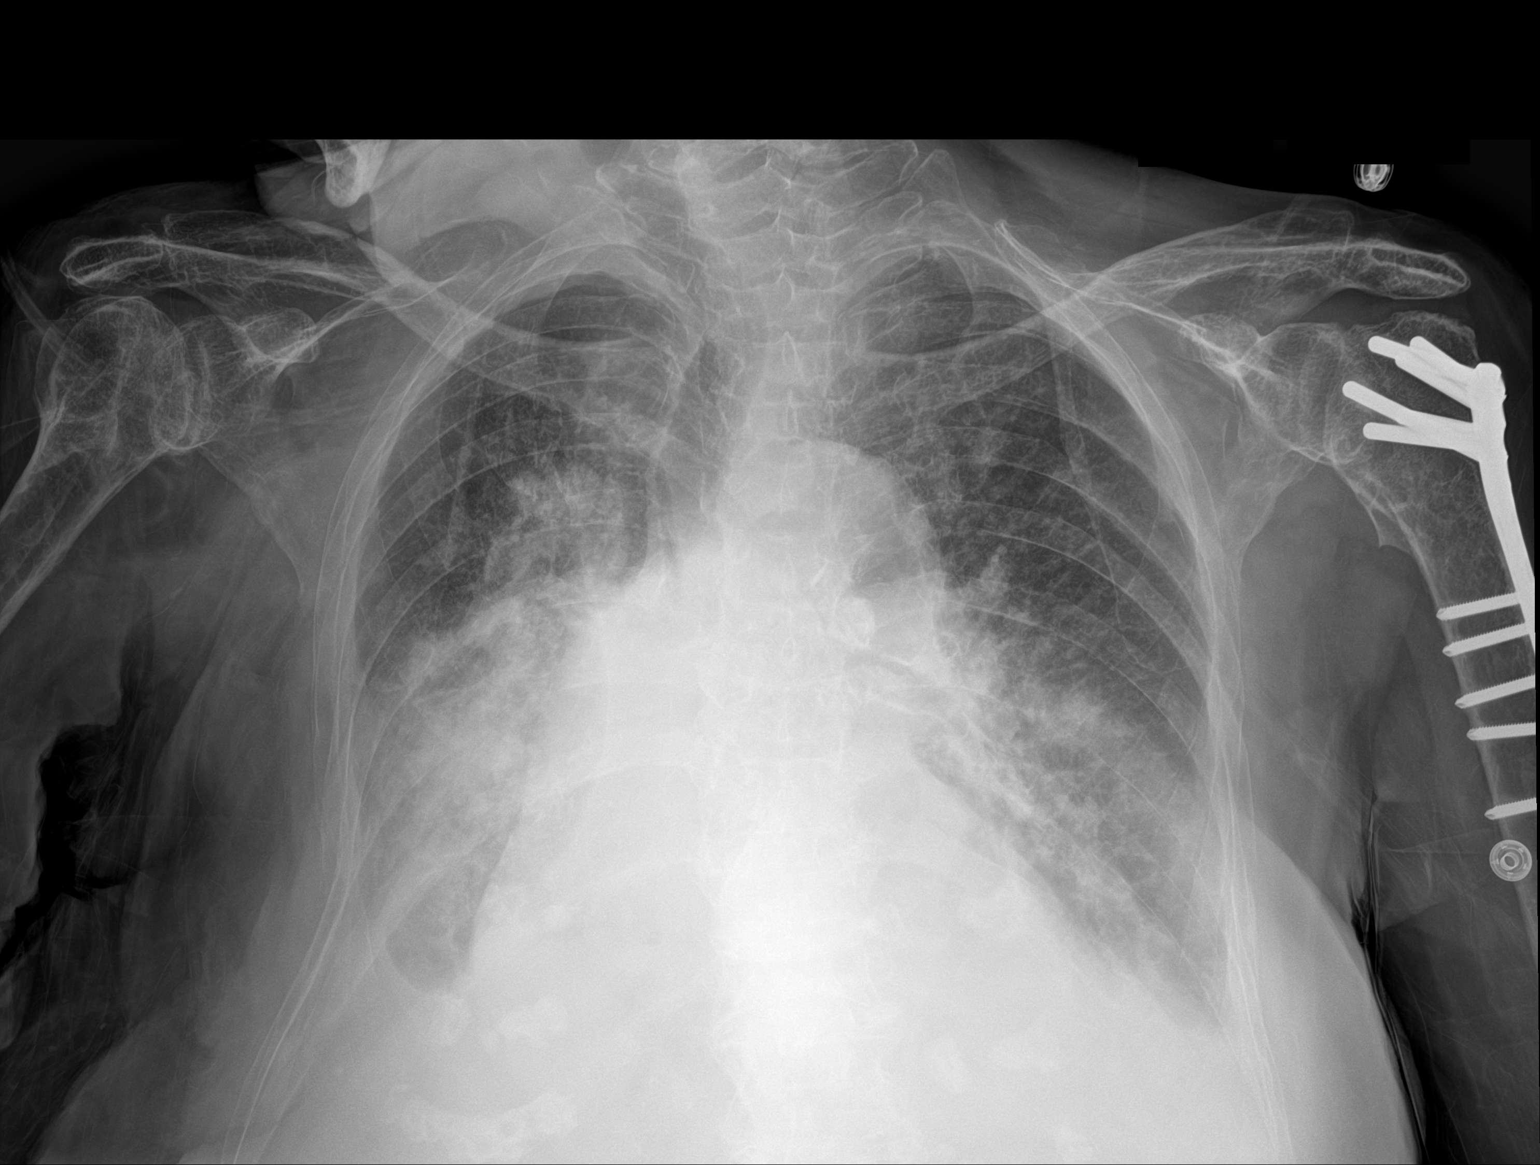

[1 of 1 positions shown; findings below may reference images not displayed]

FINDINGS: Unchanged cardiomegaly. Small pleural effusions with bibasilar
atelectasis. Hazy opacity in the right mid lung is worsened since
the prior study.
IMPRESSION: Worsening hazy opacity in the right mid lung. Unchanged cardiomegaly
and small pleural effusions.
